# Patient Record
Sex: Male | Born: 1955
Health system: Southern US, Community
[De-identification: ages and names within clinical notes are randomized; demographics above are authoritative.]

## PROBLEM LIST (undated history)

## (undated) DIAGNOSIS — L0390 Cellulitis, unspecified: Secondary | ICD-10-CM

---

## 2016-06-05 DIAGNOSIS — E291 Testicular hypofunction: Secondary | ICD-10-CM | POA: Diagnosis not present

## 2016-06-05 DIAGNOSIS — R972 Elevated prostate specific antigen [PSA]: Secondary | ICD-10-CM | POA: Diagnosis not present

## 2016-06-27 DIAGNOSIS — E291 Testicular hypofunction: Secondary | ICD-10-CM | POA: Diagnosis not present

## 2016-08-02 DIAGNOSIS — N400 Enlarged prostate without lower urinary tract symptoms: Secondary | ICD-10-CM | POA: Diagnosis not present

## 2016-08-02 DIAGNOSIS — E784 Other hyperlipidemia: Secondary | ICD-10-CM | POA: Diagnosis not present

## 2016-08-02 DIAGNOSIS — E782 Mixed hyperlipidemia: Secondary | ICD-10-CM | POA: Diagnosis not present

## 2016-08-02 DIAGNOSIS — E785 Hyperlipidemia, unspecified: Secondary | ICD-10-CM | POA: Diagnosis not present

## 2016-08-02 DIAGNOSIS — E291 Testicular hypofunction: Secondary | ICD-10-CM | POA: Diagnosis not present

## 2016-09-20 DIAGNOSIS — E7800 Pure hypercholesterolemia, unspecified: Secondary | ICD-10-CM | POA: Diagnosis not present

## 2016-09-20 DIAGNOSIS — H25013 Cortical age-related cataract, bilateral: Secondary | ICD-10-CM | POA: Diagnosis not present

## 2016-09-20 DIAGNOSIS — R0683 Snoring: Secondary | ICD-10-CM | POA: Diagnosis not present

## 2016-09-20 DIAGNOSIS — F419 Anxiety disorder, unspecified: Secondary | ICD-10-CM | POA: Diagnosis not present

## 2016-09-20 DIAGNOSIS — Z8719 Personal history of other diseases of the digestive system: Secondary | ICD-10-CM | POA: Diagnosis not present

## 2016-09-20 DIAGNOSIS — G479 Sleep disorder, unspecified: Secondary | ICD-10-CM | POA: Diagnosis not present

## 2016-09-20 DIAGNOSIS — H9193 Unspecified hearing loss, bilateral: Secondary | ICD-10-CM | POA: Diagnosis not present

## 2016-09-20 DIAGNOSIS — N401 Enlarged prostate with lower urinary tract symptoms: Secondary | ICD-10-CM | POA: Diagnosis not present

## 2016-09-20 DIAGNOSIS — K529 Noninfective gastroenteritis and colitis, unspecified: Secondary | ICD-10-CM | POA: Diagnosis not present

## 2016-09-20 DIAGNOSIS — F429 Obsessive-compulsive disorder, unspecified: Secondary | ICD-10-CM | POA: Diagnosis not present

## 2016-11-12 DIAGNOSIS — E538 Deficiency of other specified B group vitamins: Secondary | ICD-10-CM | POA: Diagnosis not present

## 2016-11-12 DIAGNOSIS — R972 Elevated prostate specific antigen [PSA]: Secondary | ICD-10-CM | POA: Diagnosis not present

## 2016-11-14 DIAGNOSIS — H903 Sensorineural hearing loss, bilateral: Secondary | ICD-10-CM | POA: Diagnosis not present

## 2016-11-14 DIAGNOSIS — G4710 Hypersomnia, unspecified: Secondary | ICD-10-CM | POA: Diagnosis not present

## 2016-11-29 DIAGNOSIS — E291 Testicular hypofunction: Secondary | ICD-10-CM | POA: Diagnosis not present

## 2016-12-04 DIAGNOSIS — L738 Other specified follicular disorders: Secondary | ICD-10-CM | POA: Diagnosis not present

## 2016-12-04 DIAGNOSIS — L821 Other seborrheic keratosis: Secondary | ICD-10-CM | POA: Diagnosis not present

## 2016-12-04 DIAGNOSIS — R202 Paresthesia of skin: Secondary | ICD-10-CM | POA: Diagnosis not present

## 2016-12-04 DIAGNOSIS — L708 Other acne: Secondary | ICD-10-CM | POA: Diagnosis not present

## 2016-12-24 DIAGNOSIS — H903 Sensorineural hearing loss, bilateral: Secondary | ICD-10-CM | POA: Diagnosis not present

## 2017-01-03 DIAGNOSIS — H2513 Age-related nuclear cataract, bilateral: Secondary | ICD-10-CM | POA: Diagnosis not present

## 2017-01-03 DIAGNOSIS — H25013 Cortical age-related cataract, bilateral: Secondary | ICD-10-CM | POA: Diagnosis not present

## 2017-01-04 DIAGNOSIS — E291 Testicular hypofunction: Secondary | ICD-10-CM | POA: Diagnosis not present

## 2017-01-29 DIAGNOSIS — Z01818 Encounter for other preprocedural examination: Secondary | ICD-10-CM | POA: Diagnosis not present

## 2017-01-29 DIAGNOSIS — E7800 Pure hypercholesterolemia, unspecified: Secondary | ICD-10-CM | POA: Diagnosis not present

## 2017-01-29 DIAGNOSIS — R5383 Other fatigue: Secondary | ICD-10-CM | POA: Diagnosis not present

## 2017-01-29 DIAGNOSIS — G4710 Hypersomnia, unspecified: Secondary | ICD-10-CM | POA: Diagnosis not present

## 2017-01-29 DIAGNOSIS — F419 Anxiety disorder, unspecified: Secondary | ICD-10-CM | POA: Diagnosis not present

## 2017-01-29 DIAGNOSIS — G4733 Obstructive sleep apnea (adult) (pediatric): Secondary | ICD-10-CM | POA: Diagnosis not present

## 2017-01-29 DIAGNOSIS — F605 Obsessive-compulsive personality disorder: Secondary | ICD-10-CM | POA: Diagnosis not present

## 2017-01-29 DIAGNOSIS — N401 Enlarged prostate with lower urinary tract symptoms: Secondary | ICD-10-CM | POA: Diagnosis not present

## 2017-01-29 DIAGNOSIS — N400 Enlarged prostate without lower urinary tract symptoms: Secondary | ICD-10-CM | POA: Diagnosis not present

## 2017-01-29 DIAGNOSIS — R0683 Snoring: Secondary | ICD-10-CM | POA: Diagnosis not present

## 2017-01-29 DIAGNOSIS — F429 Obsessive-compulsive disorder, unspecified: Secondary | ICD-10-CM | POA: Diagnosis not present

## 2017-01-29 DIAGNOSIS — F411 Generalized anxiety disorder: Secondary | ICD-10-CM | POA: Diagnosis not present

## 2017-01-29 DIAGNOSIS — Z85820 Personal history of malignant melanoma of skin: Secondary | ICD-10-CM | POA: Diagnosis not present

## 2017-01-29 DIAGNOSIS — E785 Hyperlipidemia, unspecified: Secondary | ICD-10-CM | POA: Diagnosis not present

## 2017-02-04 DIAGNOSIS — E538 Deficiency of other specified B group vitamins: Secondary | ICD-10-CM | POA: Diagnosis not present

## 2017-02-04 DIAGNOSIS — E7800 Pure hypercholesterolemia, unspecified: Secondary | ICD-10-CM | POA: Diagnosis not present

## 2017-02-04 DIAGNOSIS — R5383 Other fatigue: Secondary | ICD-10-CM | POA: Diagnosis not present

## 2017-02-05 DIAGNOSIS — H6121 Impacted cerumen, right ear: Secondary | ICD-10-CM | POA: Diagnosis not present

## 2017-02-05 DIAGNOSIS — H6591 Unspecified nonsuppurative otitis media, right ear: Secondary | ICD-10-CM | POA: Diagnosis not present

## 2017-02-06 DIAGNOSIS — M1990 Unspecified osteoarthritis, unspecified site: Secondary | ICD-10-CM | POA: Diagnosis not present

## 2017-02-06 DIAGNOSIS — H52202 Unspecified astigmatism, left eye: Secondary | ICD-10-CM | POA: Diagnosis not present

## 2017-02-06 DIAGNOSIS — H2512 Age-related nuclear cataract, left eye: Secondary | ICD-10-CM | POA: Diagnosis not present

## 2017-02-06 DIAGNOSIS — E785 Hyperlipidemia, unspecified: Secondary | ICD-10-CM | POA: Diagnosis not present

## 2017-02-06 DIAGNOSIS — G4730 Sleep apnea, unspecified: Secondary | ICD-10-CM | POA: Diagnosis not present

## 2017-02-16 DIAGNOSIS — Z1159 Encounter for screening for other viral diseases: Secondary | ICD-10-CM | POA: Diagnosis not present

## 2017-02-16 DIAGNOSIS — E291 Testicular hypofunction: Secondary | ICD-10-CM | POA: Diagnosis not present

## 2017-02-20 DIAGNOSIS — M1990 Unspecified osteoarthritis, unspecified site: Secondary | ICD-10-CM | POA: Diagnosis not present

## 2017-02-20 DIAGNOSIS — H2511 Age-related nuclear cataract, right eye: Secondary | ICD-10-CM | POA: Diagnosis not present

## 2017-02-20 DIAGNOSIS — E785 Hyperlipidemia, unspecified: Secondary | ICD-10-CM | POA: Diagnosis not present

## 2017-02-20 DIAGNOSIS — G4730 Sleep apnea, unspecified: Secondary | ICD-10-CM | POA: Diagnosis not present

## 2017-02-20 DIAGNOSIS — H25011 Cortical age-related cataract, right eye: Secondary | ICD-10-CM | POA: Diagnosis not present

## 2017-02-25 DIAGNOSIS — Z7189 Other specified counseling: Secondary | ICD-10-CM | POA: Diagnosis not present

## 2017-02-25 DIAGNOSIS — E349 Endocrine disorder, unspecified: Secondary | ICD-10-CM | POA: Diagnosis not present

## 2017-02-25 DIAGNOSIS — Z0000 Encounter for general adult medical examination without abnormal findings: Secondary | ICD-10-CM | POA: Diagnosis not present

## 2017-02-25 DIAGNOSIS — E291 Testicular hypofunction: Secondary | ICD-10-CM | POA: Diagnosis not present

## 2017-02-25 DIAGNOSIS — F429 Obsessive-compulsive disorder, unspecified: Secondary | ICD-10-CM | POA: Diagnosis not present

## 2017-02-25 DIAGNOSIS — N401 Enlarged prostate with lower urinary tract symptoms: Secondary | ICD-10-CM | POA: Diagnosis not present

## 2017-02-25 DIAGNOSIS — F419 Anxiety disorder, unspecified: Secondary | ICD-10-CM | POA: Diagnosis not present

## 2017-02-25 DIAGNOSIS — Z85820 Personal history of malignant melanoma of skin: Secondary | ICD-10-CM | POA: Diagnosis not present

## 2017-02-25 DIAGNOSIS — R7301 Impaired fasting glucose: Secondary | ICD-10-CM | POA: Diagnosis not present

## 2017-02-25 DIAGNOSIS — E7800 Pure hypercholesterolemia, unspecified: Secondary | ICD-10-CM | POA: Diagnosis not present

## 2017-03-15 DIAGNOSIS — D509 Iron deficiency anemia, unspecified: Secondary | ICD-10-CM | POA: Diagnosis not present

## 2017-03-21 DIAGNOSIS — H52202 Unspecified astigmatism, left eye: Secondary | ICD-10-CM | POA: Diagnosis not present

## 2017-03-21 DIAGNOSIS — E291 Testicular hypofunction: Secondary | ICD-10-CM | POA: Diagnosis not present

## 2017-03-21 DIAGNOSIS — H26492 Other secondary cataract, left eye: Secondary | ICD-10-CM | POA: Diagnosis not present

## 2017-03-22 DIAGNOSIS — N401 Enlarged prostate with lower urinary tract symptoms: Secondary | ICD-10-CM | POA: Diagnosis not present

## 2017-03-22 DIAGNOSIS — R972 Elevated prostate specific antigen [PSA]: Secondary | ICD-10-CM | POA: Diagnosis not present

## 2017-03-22 DIAGNOSIS — R3912 Poor urinary stream: Secondary | ICD-10-CM | POA: Diagnosis not present

## 2017-03-25 DIAGNOSIS — Z23 Encounter for immunization: Secondary | ICD-10-CM | POA: Diagnosis not present

## 2017-03-26 DIAGNOSIS — L729 Follicular cyst of the skin and subcutaneous tissue, unspecified: Secondary | ICD-10-CM | POA: Diagnosis not present

## 2017-03-26 DIAGNOSIS — H26491 Other secondary cataract, right eye: Secondary | ICD-10-CM | POA: Diagnosis not present

## 2017-04-04 DIAGNOSIS — L729 Follicular cyst of the skin and subcutaneous tissue, unspecified: Secondary | ICD-10-CM | POA: Diagnosis not present

## 2017-04-08 DIAGNOSIS — L729 Follicular cyst of the skin and subcutaneous tissue, unspecified: Secondary | ICD-10-CM | POA: Diagnosis not present

## 2017-04-09 DIAGNOSIS — L600 Ingrowing nail: Secondary | ICD-10-CM | POA: Diagnosis not present

## 2017-04-19 DIAGNOSIS — L600 Ingrowing nail: Secondary | ICD-10-CM | POA: Diagnosis not present

## 2017-05-01 DIAGNOSIS — E291 Testicular hypofunction: Secondary | ICD-10-CM | POA: Diagnosis not present

## 2017-05-03 DIAGNOSIS — L02611 Cutaneous abscess of right foot: Secondary | ICD-10-CM | POA: Diagnosis not present

## 2017-05-09 DIAGNOSIS — L600 Ingrowing nail: Secondary | ICD-10-CM | POA: Diagnosis not present

## 2017-05-13 DIAGNOSIS — H53002 Unspecified amblyopia, left eye: Secondary | ICD-10-CM | POA: Diagnosis not present

## 2017-05-15 DIAGNOSIS — L239 Allergic contact dermatitis, unspecified cause: Secondary | ICD-10-CM | POA: Diagnosis not present

## 2017-06-04 DIAGNOSIS — N401 Enlarged prostate with lower urinary tract symptoms: Secondary | ICD-10-CM | POA: Diagnosis not present

## 2017-06-04 DIAGNOSIS — R3914 Feeling of incomplete bladder emptying: Secondary | ICD-10-CM | POA: Diagnosis not present

## 2017-06-14 DIAGNOSIS — E291 Testicular hypofunction: Secondary | ICD-10-CM | POA: Diagnosis not present

## 2017-06-27 DIAGNOSIS — E291 Testicular hypofunction: Secondary | ICD-10-CM | POA: Diagnosis not present

## 2017-08-06 DIAGNOSIS — E291 Testicular hypofunction: Secondary | ICD-10-CM | POA: Diagnosis not present

## 2017-08-06 DIAGNOSIS — E7800 Pure hypercholesterolemia, unspecified: Secondary | ICD-10-CM | POA: Diagnosis not present

## 2017-08-24 DIAGNOSIS — E291 Testicular hypofunction: Secondary | ICD-10-CM | POA: Diagnosis not present

## 2017-09-10 DIAGNOSIS — R972 Elevated prostate specific antigen [PSA]: Secondary | ICD-10-CM | POA: Diagnosis not present

## 2017-09-16 DIAGNOSIS — E291 Testicular hypofunction: Secondary | ICD-10-CM | POA: Diagnosis not present

## 2017-09-30 DIAGNOSIS — R3912 Poor urinary stream: Secondary | ICD-10-CM | POA: Diagnosis not present

## 2017-09-30 DIAGNOSIS — R972 Elevated prostate specific antigen [PSA]: Secondary | ICD-10-CM | POA: Diagnosis not present

## 2017-10-07 DIAGNOSIS — R972 Elevated prostate specific antigen [PSA]: Secondary | ICD-10-CM | POA: Diagnosis not present

## 2017-10-11 DIAGNOSIS — R351 Nocturia: Secondary | ICD-10-CM | POA: Diagnosis not present

## 2018-03-03 DIAGNOSIS — E7800 Pure hypercholesterolemia, unspecified: Secondary | ICD-10-CM | POA: Diagnosis not present

## 2018-03-03 DIAGNOSIS — Z8739 Personal history of other diseases of the musculoskeletal system and connective tissue: Secondary | ICD-10-CM | POA: Diagnosis not present

## 2018-03-03 DIAGNOSIS — F419 Anxiety disorder, unspecified: Secondary | ICD-10-CM | POA: Diagnosis not present

## 2018-03-03 DIAGNOSIS — Z0000 Encounter for general adult medical examination without abnormal findings: Secondary | ICD-10-CM | POA: Diagnosis not present

## 2018-03-03 DIAGNOSIS — E291 Testicular hypofunction: Secondary | ICD-10-CM | POA: Diagnosis not present

## 2018-03-03 DIAGNOSIS — Z85820 Personal history of malignant melanoma of skin: Secondary | ICD-10-CM | POA: Diagnosis not present

## 2018-03-03 DIAGNOSIS — N400 Enlarged prostate without lower urinary tract symptoms: Secondary | ICD-10-CM | POA: Diagnosis not present

## 2018-03-03 DIAGNOSIS — Z23 Encounter for immunization: Secondary | ICD-10-CM | POA: Diagnosis not present

## 2018-03-26 DIAGNOSIS — R972 Elevated prostate specific antigen [PSA]: Secondary | ICD-10-CM | POA: Diagnosis not present

## 2018-03-26 DIAGNOSIS — N401 Enlarged prostate with lower urinary tract symptoms: Secondary | ICD-10-CM | POA: Diagnosis not present

## 2018-03-26 DIAGNOSIS — R3912 Poor urinary stream: Secondary | ICD-10-CM | POA: Diagnosis not present

## 2018-03-26 DIAGNOSIS — N5201 Erectile dysfunction due to arterial insufficiency: Secondary | ICD-10-CM | POA: Diagnosis not present

## 2018-05-13 DIAGNOSIS — Z85820 Personal history of malignant melanoma of skin: Secondary | ICD-10-CM | POA: Diagnosis not present

## 2018-05-13 DIAGNOSIS — D485 Neoplasm of uncertain behavior of skin: Secondary | ICD-10-CM | POA: Diagnosis not present

## 2018-05-13 DIAGNOSIS — D2271 Melanocytic nevi of right lower limb, including hip: Secondary | ICD-10-CM | POA: Diagnosis not present

## 2018-05-13 DIAGNOSIS — L218 Other seborrheic dermatitis: Secondary | ICD-10-CM | POA: Diagnosis not present

## 2018-05-13 DIAGNOSIS — L814 Other melanin hyperpigmentation: Secondary | ICD-10-CM | POA: Diagnosis not present

## 2018-06-27 DIAGNOSIS — I10 Essential (primary) hypertension: Secondary | ICD-10-CM | POA: Diagnosis not present

## 2018-06-27 DIAGNOSIS — E349 Endocrine disorder, unspecified: Secondary | ICD-10-CM | POA: Diagnosis not present

## 2018-06-27 DIAGNOSIS — Z5181 Encounter for therapeutic drug level monitoring: Secondary | ICD-10-CM | POA: Diagnosis not present

## 2018-06-27 DIAGNOSIS — Z8639 Personal history of other endocrine, nutritional and metabolic disease: Secondary | ICD-10-CM | POA: Diagnosis not present

## 2018-06-30 DIAGNOSIS — I10 Essential (primary) hypertension: Secondary | ICD-10-CM | POA: Diagnosis not present

## 2018-06-30 DIAGNOSIS — Z79899 Other long term (current) drug therapy: Secondary | ICD-10-CM | POA: Diagnosis not present

## 2018-06-30 DIAGNOSIS — E349 Endocrine disorder, unspecified: Secondary | ICD-10-CM | POA: Diagnosis not present

## 2018-06-30 DIAGNOSIS — Z8639 Personal history of other endocrine, nutritional and metabolic disease: Secondary | ICD-10-CM | POA: Diagnosis not present

## 2018-07-04 DIAGNOSIS — F419 Anxiety disorder, unspecified: Secondary | ICD-10-CM | POA: Diagnosis not present

## 2018-07-04 DIAGNOSIS — M8580 Other specified disorders of bone density and structure, unspecified site: Secondary | ICD-10-CM | POA: Diagnosis not present

## 2018-07-04 DIAGNOSIS — E7800 Pure hypercholesterolemia, unspecified: Secondary | ICD-10-CM | POA: Diagnosis not present

## 2018-07-24 DIAGNOSIS — E349 Endocrine disorder, unspecified: Secondary | ICD-10-CM | POA: Diagnosis not present

## 2018-08-08 ENCOUNTER — Other Ambulatory Visit: Payer: Self-pay | Admitting: Family Medicine

## 2018-08-08 DIAGNOSIS — M8580 Other specified disorders of bone density and structure, unspecified site: Secondary | ICD-10-CM

## 2018-08-11 DIAGNOSIS — N5201 Erectile dysfunction due to arterial insufficiency: Secondary | ICD-10-CM | POA: Diagnosis not present

## 2018-08-11 DIAGNOSIS — R3912 Poor urinary stream: Secondary | ICD-10-CM | POA: Diagnosis not present

## 2018-08-15 DIAGNOSIS — E349 Endocrine disorder, unspecified: Secondary | ICD-10-CM | POA: Diagnosis not present

## 2018-08-15 DIAGNOSIS — Z5181 Encounter for therapeutic drug level monitoring: Secondary | ICD-10-CM | POA: Diagnosis not present

## 2018-08-15 DIAGNOSIS — N400 Enlarged prostate without lower urinary tract symptoms: Secondary | ICD-10-CM | POA: Diagnosis not present

## 2018-08-15 DIAGNOSIS — Z125 Encounter for screening for malignant neoplasm of prostate: Secondary | ICD-10-CM | POA: Diagnosis not present

## 2018-08-18 DIAGNOSIS — M545 Low back pain: Secondary | ICD-10-CM | POA: Diagnosis not present

## 2018-09-19 DIAGNOSIS — R972 Elevated prostate specific antigen [PSA]: Secondary | ICD-10-CM | POA: Diagnosis not present

## 2018-09-26 DIAGNOSIS — R3912 Poor urinary stream: Secondary | ICD-10-CM | POA: Diagnosis not present

## 2018-09-26 DIAGNOSIS — N5201 Erectile dysfunction due to arterial insufficiency: Secondary | ICD-10-CM | POA: Diagnosis not present

## 2018-09-26 DIAGNOSIS — N401 Enlarged prostate with lower urinary tract symptoms: Secondary | ICD-10-CM | POA: Diagnosis not present

## 2018-09-26 DIAGNOSIS — R972 Elevated prostate specific antigen [PSA]: Secondary | ICD-10-CM | POA: Diagnosis not present

## 2018-10-14 DIAGNOSIS — E291 Testicular hypofunction: Secondary | ICD-10-CM | POA: Diagnosis not present

## 2018-10-17 ENCOUNTER — Other Ambulatory Visit: Payer: Self-pay

## 2018-10-21 DIAGNOSIS — N400 Enlarged prostate without lower urinary tract symptoms: Secondary | ICD-10-CM | POA: Diagnosis not present

## 2018-10-21 DIAGNOSIS — E349 Endocrine disorder, unspecified: Secondary | ICD-10-CM | POA: Diagnosis not present

## 2018-10-21 DIAGNOSIS — Z125 Encounter for screening for malignant neoplasm of prostate: Secondary | ICD-10-CM | POA: Diagnosis not present

## 2018-10-21 DIAGNOSIS — Z5181 Encounter for therapeutic drug level monitoring: Secondary | ICD-10-CM | POA: Diagnosis not present

## 2018-11-11 DIAGNOSIS — Z961 Presence of intraocular lens: Secondary | ICD-10-CM | POA: Diagnosis not present

## 2018-11-11 DIAGNOSIS — H524 Presbyopia: Secondary | ICD-10-CM | POA: Diagnosis not present

## 2018-11-11 DIAGNOSIS — H5310 Unspecified subjective visual disturbances: Secondary | ICD-10-CM | POA: Diagnosis not present

## 2018-11-11 DIAGNOSIS — H53002 Unspecified amblyopia, left eye: Secondary | ICD-10-CM | POA: Diagnosis not present

## 2018-12-10 ENCOUNTER — Ambulatory Visit
Admission: RE | Admit: 2018-12-10 | Discharge: 2018-12-10 | Disposition: A | Discharge status: Home / Self Care | Payer: Medicare Other | Source: Ambulatory Visit | Attending: Family Medicine | Admitting: Family Medicine

## 2018-12-10 DIAGNOSIS — M8580 Other specified disorders of bone density and structure, unspecified site: Secondary | ICD-10-CM

## 2018-12-10 DIAGNOSIS — Z13820 Encounter for screening for osteoporosis: Secondary | ICD-10-CM | POA: Diagnosis not present

## 2018-12-29 ENCOUNTER — Other Ambulatory Visit: Payer: Self-pay | Admitting: Urology

## 2018-12-29 DIAGNOSIS — R350 Frequency of micturition: Secondary | ICD-10-CM | POA: Diagnosis not present

## 2018-12-29 DIAGNOSIS — E349 Endocrine disorder, unspecified: Secondary | ICD-10-CM | POA: Diagnosis not present

## 2018-12-29 DIAGNOSIS — R972 Elevated prostate specific antigen [PSA]: Secondary | ICD-10-CM

## 2018-12-29 DIAGNOSIS — N401 Enlarged prostate with lower urinary tract symptoms: Secondary | ICD-10-CM | POA: Diagnosis not present

## 2018-12-29 DIAGNOSIS — N5201 Erectile dysfunction due to arterial insufficiency: Secondary | ICD-10-CM | POA: Diagnosis not present

## 2019-01-26 ENCOUNTER — Ambulatory Visit
Admission: RE | Admit: 2019-01-26 | Discharge: 2019-01-26 | Disposition: A | Discharge status: Home / Self Care | Payer: Medicare Other | Source: Ambulatory Visit | Attending: Urology | Admitting: Urology

## 2019-01-26 ENCOUNTER — Other Ambulatory Visit: Payer: Self-pay

## 2019-01-26 DIAGNOSIS — R972 Elevated prostate specific antigen [PSA]: Secondary | ICD-10-CM

## 2019-01-26 DIAGNOSIS — N400 Enlarged prostate without lower urinary tract symptoms: Secondary | ICD-10-CM | POA: Diagnosis not present

## 2019-01-26 MED ORDER — GADOBENATE DIMEGLUMINE 529 MG/ML IV SOLN
16.0000 mL | Freq: Once | INTRAVENOUS | Status: AC | PRN
  Administered 2019-01-26: 16 mL via INTRAVENOUS

## 2019-01-29 DIAGNOSIS — N401 Enlarged prostate with lower urinary tract symptoms: Secondary | ICD-10-CM | POA: Diagnosis not present

## 2019-01-29 DIAGNOSIS — R350 Frequency of micturition: Secondary | ICD-10-CM | POA: Diagnosis not present

## 2019-01-29 DIAGNOSIS — R972 Elevated prostate specific antigen [PSA]: Secondary | ICD-10-CM | POA: Diagnosis not present

## 2019-01-29 DIAGNOSIS — E349 Endocrine disorder, unspecified: Secondary | ICD-10-CM | POA: Diagnosis not present

## 2019-02-04 DIAGNOSIS — R6889 Other general symptoms and signs: Secondary | ICD-10-CM | POA: Diagnosis not present

## 2019-02-09 DIAGNOSIS — N410 Acute prostatitis: Secondary | ICD-10-CM | POA: Diagnosis not present

## 2019-03-03 DIAGNOSIS — R42 Dizziness and giddiness: Secondary | ICD-10-CM | POA: Diagnosis not present

## 2019-03-03 DIAGNOSIS — H9313 Tinnitus, bilateral: Secondary | ICD-10-CM | POA: Diagnosis not present

## 2019-03-03 DIAGNOSIS — H9319 Tinnitus, unspecified ear: Secondary | ICD-10-CM | POA: Diagnosis not present

## 2019-03-13 DIAGNOSIS — Z0000 Encounter for general adult medical examination without abnormal findings: Secondary | ICD-10-CM | POA: Diagnosis not present

## 2019-03-13 DIAGNOSIS — F419 Anxiety disorder, unspecified: Secondary | ICD-10-CM | POA: Diagnosis not present

## 2019-03-13 DIAGNOSIS — Z23 Encounter for immunization: Secondary | ICD-10-CM | POA: Diagnosis not present

## 2019-03-13 DIAGNOSIS — F605 Obsessive-compulsive personality disorder: Secondary | ICD-10-CM | POA: Diagnosis not present

## 2019-03-13 DIAGNOSIS — N400 Enlarged prostate without lower urinary tract symptoms: Secondary | ICD-10-CM | POA: Diagnosis not present

## 2019-03-13 DIAGNOSIS — E291 Testicular hypofunction: Secondary | ICD-10-CM | POA: Diagnosis not present

## 2019-03-13 DIAGNOSIS — E7800 Pure hypercholesterolemia, unspecified: Secondary | ICD-10-CM | POA: Diagnosis not present

## 2019-03-13 DIAGNOSIS — Z85820 Personal history of malignant melanoma of skin: Secondary | ICD-10-CM | POA: Diagnosis not present

## 2019-03-23 DIAGNOSIS — H0100A Unspecified blepharitis right eye, upper and lower eyelids: Secondary | ICD-10-CM | POA: Diagnosis not present

## 2019-03-25 DIAGNOSIS — R3912 Poor urinary stream: Secondary | ICD-10-CM | POA: Diagnosis not present

## 2019-03-25 DIAGNOSIS — R350 Frequency of micturition: Secondary | ICD-10-CM | POA: Diagnosis not present

## 2019-03-25 DIAGNOSIS — N401 Enlarged prostate with lower urinary tract symptoms: Secondary | ICD-10-CM | POA: Diagnosis not present

## 2019-05-11 DIAGNOSIS — D2272 Melanocytic nevi of left lower limb, including hip: Secondary | ICD-10-CM | POA: Diagnosis not present

## 2019-05-11 DIAGNOSIS — D2261 Melanocytic nevi of right upper limb, including shoulder: Secondary | ICD-10-CM | POA: Diagnosis not present

## 2019-05-11 DIAGNOSIS — D2262 Melanocytic nevi of left upper limb, including shoulder: Secondary | ICD-10-CM | POA: Diagnosis not present

## 2019-05-11 DIAGNOSIS — D2271 Melanocytic nevi of right lower limb, including hip: Secondary | ICD-10-CM | POA: Diagnosis not present

## 2019-05-11 DIAGNOSIS — Z85820 Personal history of malignant melanoma of skin: Secondary | ICD-10-CM | POA: Diagnosis not present

## 2019-05-11 DIAGNOSIS — D225 Melanocytic nevi of trunk: Secondary | ICD-10-CM | POA: Diagnosis not present

## 2019-05-11 DIAGNOSIS — L738 Other specified follicular disorders: Secondary | ICD-10-CM | POA: Diagnosis not present

## 2019-05-11 DIAGNOSIS — D485 Neoplasm of uncertain behavior of skin: Secondary | ICD-10-CM | POA: Diagnosis not present

## 2019-05-11 DIAGNOSIS — L821 Other seborrheic keratosis: Secondary | ICD-10-CM | POA: Diagnosis not present

## 2019-06-03 DIAGNOSIS — H43813 Vitreous degeneration, bilateral: Secondary | ICD-10-CM | POA: Diagnosis not present

## 2019-06-03 DIAGNOSIS — H53022 Refractive amblyopia, left eye: Secondary | ICD-10-CM | POA: Diagnosis not present

## 2019-06-03 DIAGNOSIS — H3554 Dystrophies primarily involving the retinal pigment epithelium: Secondary | ICD-10-CM | POA: Diagnosis not present

## 2019-06-03 DIAGNOSIS — H43392 Other vitreous opacities, left eye: Secondary | ICD-10-CM | POA: Diagnosis not present

## 2019-06-11 DIAGNOSIS — R338 Other retention of urine: Secondary | ICD-10-CM | POA: Diagnosis not present

## 2019-07-07 DIAGNOSIS — R3912 Poor urinary stream: Secondary | ICD-10-CM | POA: Diagnosis not present

## 2019-07-07 DIAGNOSIS — M25511 Pain in right shoulder: Secondary | ICD-10-CM | POA: Diagnosis not present

## 2019-07-07 DIAGNOSIS — R338 Other retention of urine: Secondary | ICD-10-CM | POA: Diagnosis not present

## 2019-07-08 ENCOUNTER — Other Ambulatory Visit: Payer: Self-pay | Admitting: Family Medicine

## 2019-07-08 ENCOUNTER — Ambulatory Visit
Admission: RE | Admit: 2019-07-08 | Discharge: 2019-07-08 | Disposition: A | Discharge status: Home / Self Care | Payer: Medicare Other | Source: Ambulatory Visit | Attending: Family Medicine | Admitting: Family Medicine

## 2019-07-08 DIAGNOSIS — M25511 Pain in right shoulder: Secondary | ICD-10-CM | POA: Diagnosis not present

## 2019-07-08 DIAGNOSIS — M47812 Spondylosis without myelopathy or radiculopathy, cervical region: Secondary | ICD-10-CM | POA: Diagnosis not present

## 2019-07-11 ENCOUNTER — Ambulatory Visit: Payer: Medicare Other | Attending: Internal Medicine

## 2019-07-11 DIAGNOSIS — Z23 Encounter for immunization: Secondary | ICD-10-CM

## 2019-07-11 NOTE — Progress Notes (Signed)
   Covid-19 Vaccination Clinic  Name:  Joshua Glover    MRN: [id number] DOB: 12/13/1955  07/11/2019  [name] was observed post Covid-19 immunization for 15 minutes without incidence. He was provided with Vaccine Information Sheet and instruction to access the V-Safe system.   [name] was instructed to call 911 with any severe reactions post vaccine: Marland Kitchen Difficulty breathing  . Swelling of your face and throat  . A fast heartbeat  . A bad rash all over your body  . Dizziness and weakness    Immunizations Administered    Name Date Dose VIS Date Route   Pfizer COVID-19 Vaccine 07/11/2019  1:58 PM 0.3 mL 05/08/2019 Intramuscular   Manufacturer: [place]   Lot: [id number]   [place]: [id number]

## 2019-07-15 ENCOUNTER — Other Ambulatory Visit: Payer: Self-pay | Admitting: Family Medicine

## 2019-07-15 DIAGNOSIS — M5030 Other cervical disc degeneration, unspecified cervical region: Secondary | ICD-10-CM

## 2019-08-03 ENCOUNTER — Other Ambulatory Visit: Payer: Self-pay

## 2019-08-03 ENCOUNTER — Ambulatory Visit
Admission: RE | Admit: 2019-08-03 | Discharge: 2019-08-03 | Disposition: A | Discharge status: Home / Self Care | Payer: Medicare Other | Source: Ambulatory Visit | Attending: Family Medicine | Admitting: Family Medicine

## 2019-08-03 DIAGNOSIS — M4802 Spinal stenosis, cervical region: Secondary | ICD-10-CM | POA: Diagnosis not present

## 2019-08-03 DIAGNOSIS — M5030 Other cervical disc degeneration, unspecified cervical region: Secondary | ICD-10-CM

## 2019-08-03 IMAGING — MR MR CERVICAL SPINE W/O CM
5 series · 32 of 48 positions shown · non-contrast
Comparison: Cervical spine radiographs [DATE]

CLINICAL DATA: Cervical disc degeneration. Neck pain and right arm
pain

EXAM:
MRI CERVICAL SPINE WITHOUT CONTRAST
TECHNIQUE: Multiplanar, multisequence MR imaging of the cervical spine was
performed. No intravenous contrast was administered.

[Series 2: T2 · sagittal · 3.0mm · 0.41mm/px · 6 of 13 slices shown (1 of 2)]
[im 1/13]
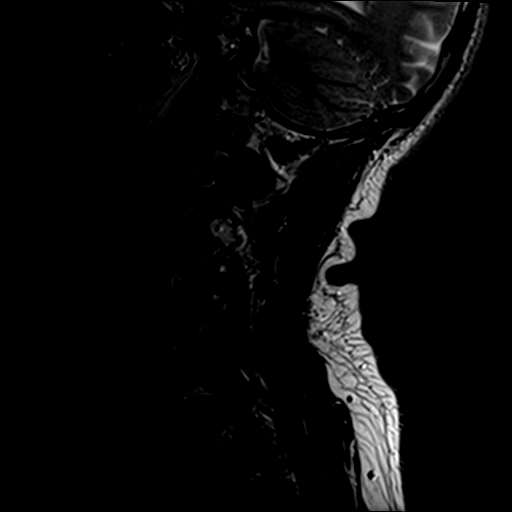
[im 3/13]
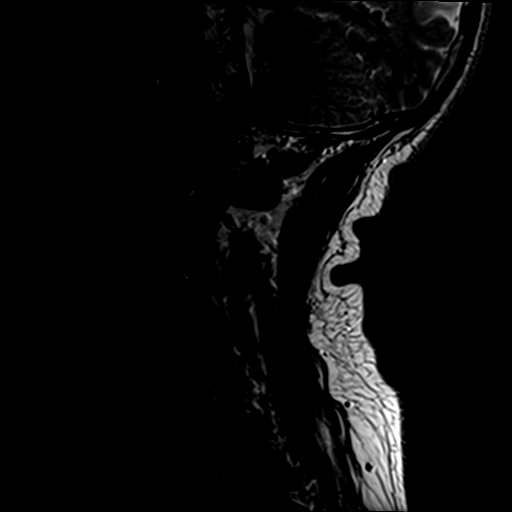
[im 5/13]
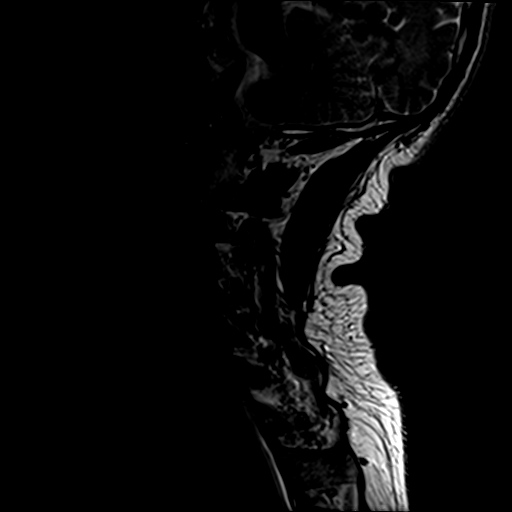
[im 8/13]
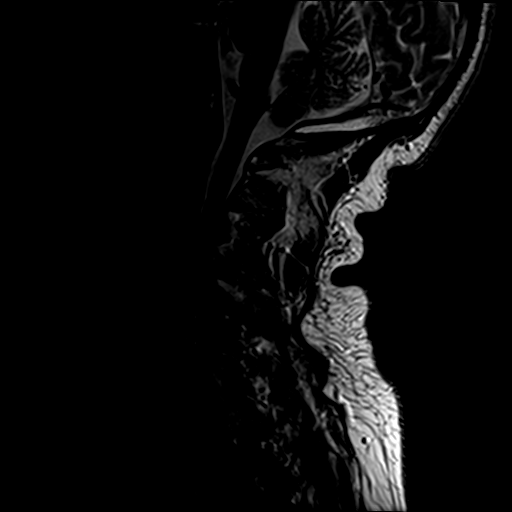
[im 10/13]
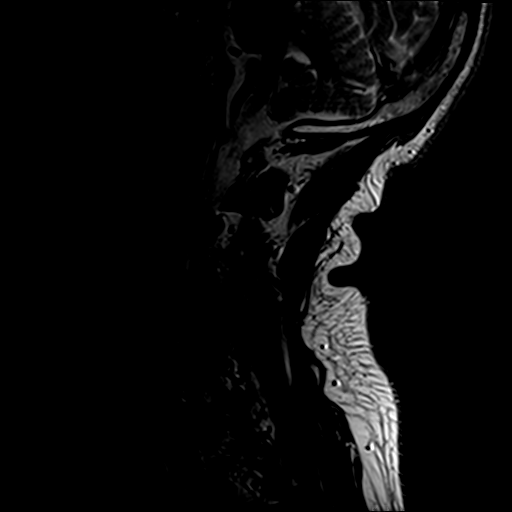
[im 13/13]
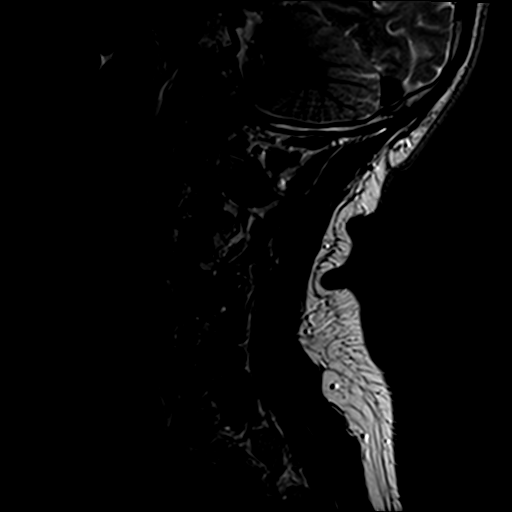

[Series 3: T1 · sagittal · 3.0mm · 0.41mm/px · 7 of 13 slices shown]
[im 1/13]
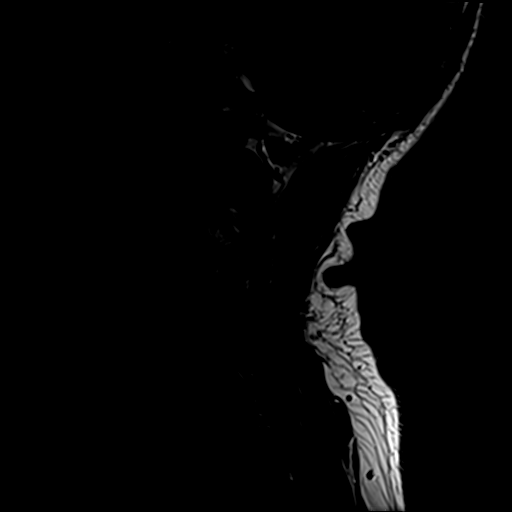
[im 3/13]
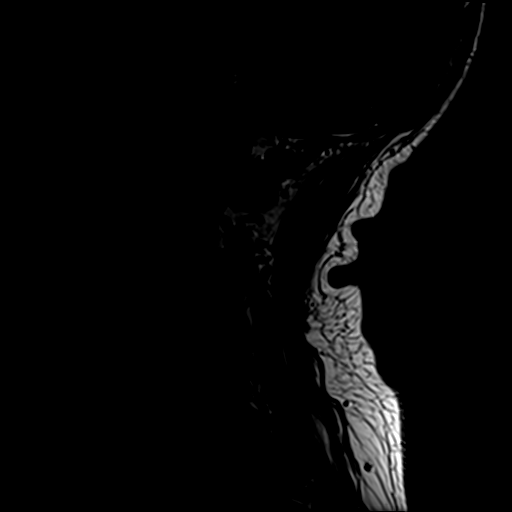
[im 5/13]
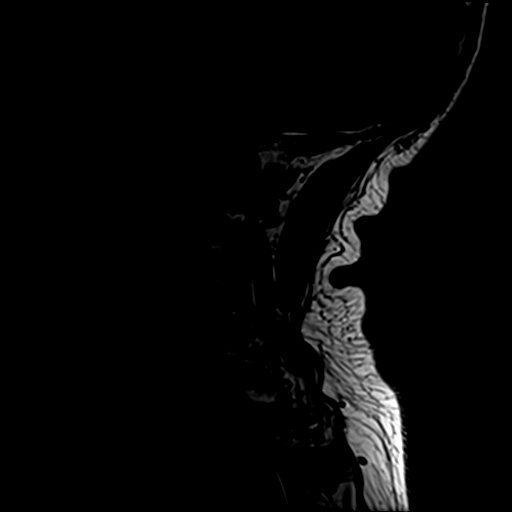
[im 7/13]
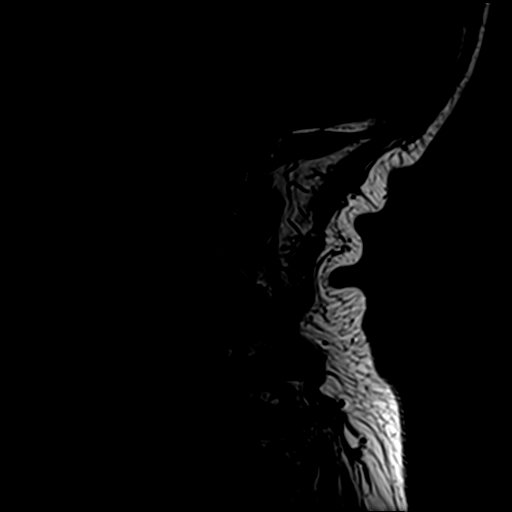
[im 9/13]
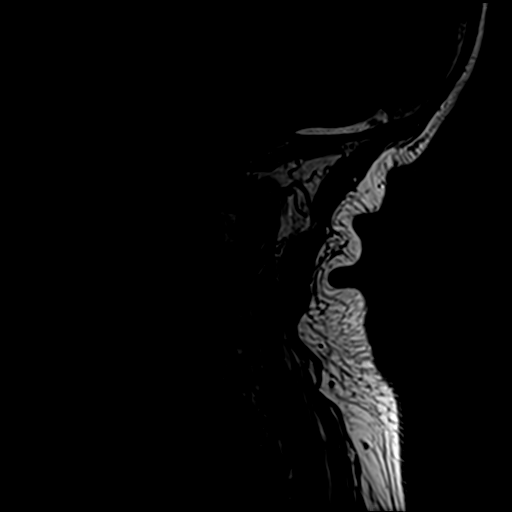
[im 11/13]
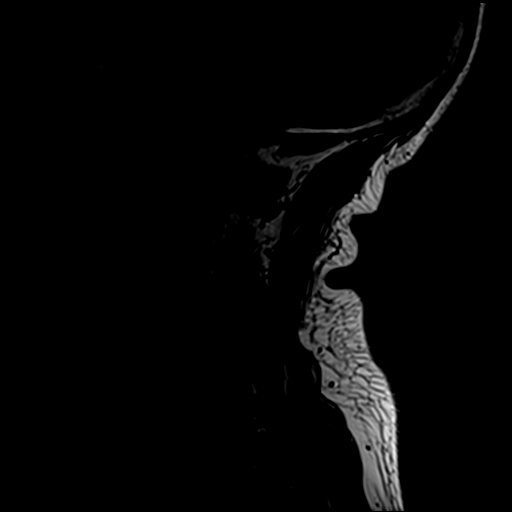
[im 13/13]
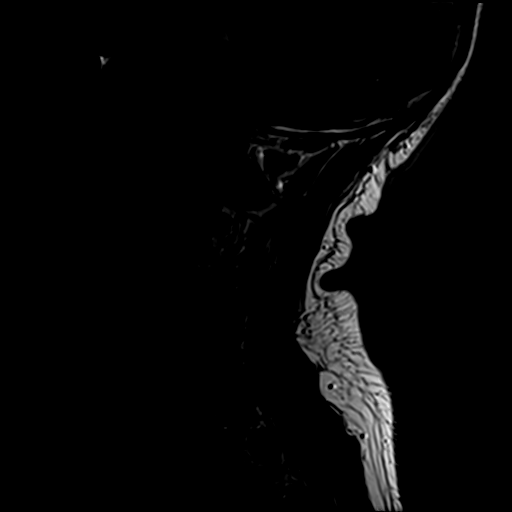

[Series 4: STIR · sagittal · 3.0mm · 0.82mm/px · 7 of 13 slices shown]
[im 1/13]
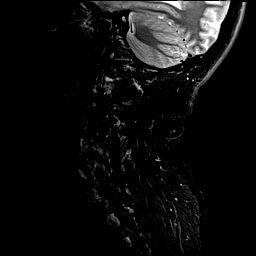
[im 3/13]
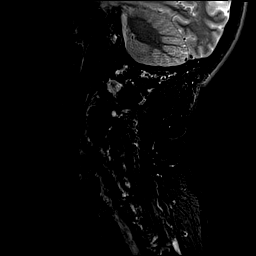
[im 5/13]
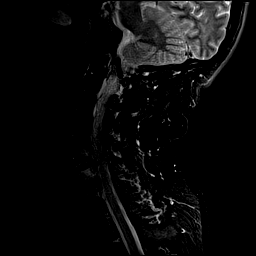
[im 7/13]
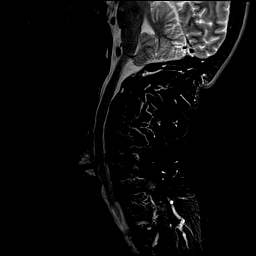
[im 9/13]
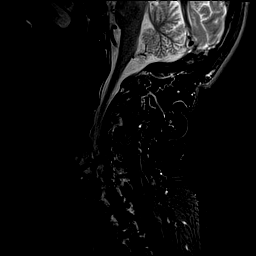
[im 11/13]
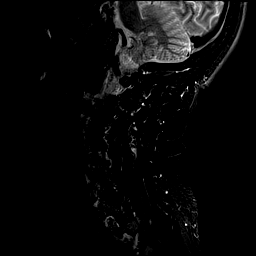
[im 13/13]
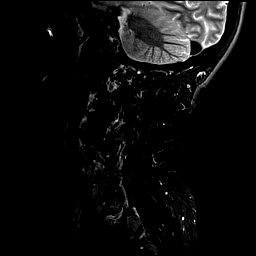

[Series 5: GRE · axial · 3.0mm · 0.47mm/px · z∈[-112,-72]mm · 4 of 26 slices shown]
[im 1/26]
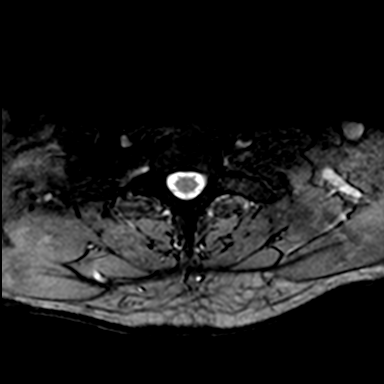
[im 4/26]
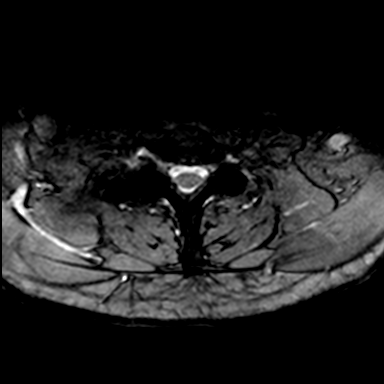
[im 8/26]
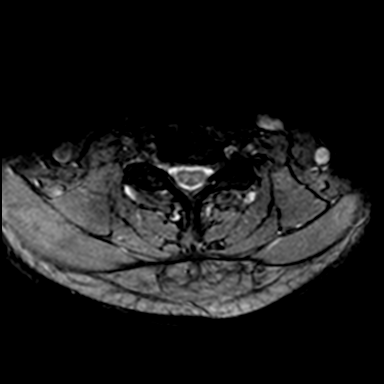
[im 12/26]
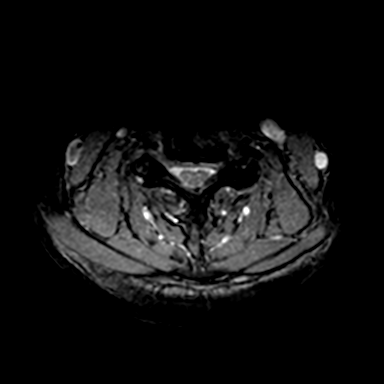

[Series 6: T2 · axial · 3.0mm · 0.94mm/px · z∈[-112,-22]mm · 8 of 26 slices shown (2 of 2)]
[im 1/26]
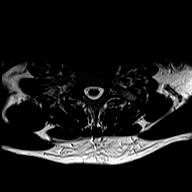
[im 4/26]
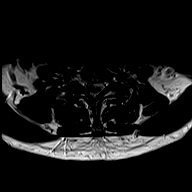
[im 8/26]
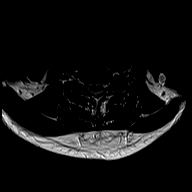
[im 12/26]
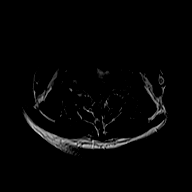
[im 14/26]
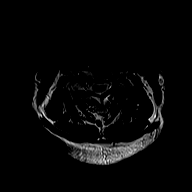
[im 18/26]
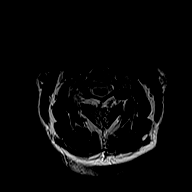
[im 22/26]
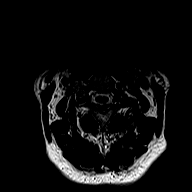
[im 26/26]
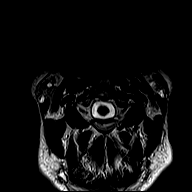

[32 of 48 positions shown; findings below may reference images not displayed]

FINDINGS: Alignment: Mild retrolisthesis C3-4. Mild anterolisthesis C4-5 and
C5-6

Vertebrae: Negative for fracture or mass. Discogenic edema at C5-6
related to disc degeneration.

Cord: No cord signal abnormality.

Posterior Fossa, vertebral arteries, paraspinal tissues: Negative

Disc levels:

C2-3: Mild disc degeneration and spurring. Left facet degeneration.
Mild left foraminal narrowing due to spurring.

C3-4: Disc degeneration with diffuse uncinate spurring and mild
facet degeneration. Mild foraminal stenosis bilaterally

C4-5: Disc degeneration with diffuse uncinate spurring. Moderate
facet degeneration bilaterally. Moderate left foraminal narrowing
and mild right foraminal narrowing due to spurring.

C5-6: Disc degeneration with diffuse uncinate spurring. Cord
flattening with mild spinal stenosis. Moderate foraminal narrowing
bilaterally left greater than right

C6-7: Disc degeneration with diffuse uncinate spurring. Mild spinal
stenosis and moderate foraminal stenosis bilaterally left greater
than right. Bilateral facet degeneration

C7-T1: Bilateral facet degeneration with mild foraminal stenosis
bilaterally.
IMPRESSION: Multilevel disc and facet degeneration throughout the cervical
spine. There is spinal and foraminal stenosis at multiple levels due
to spurring as described above.

## 2019-08-04 ENCOUNTER — Ambulatory Visit: Payer: Medicare Other | Attending: Internal Medicine

## 2019-08-04 DIAGNOSIS — Z23 Encounter for immunization: Secondary | ICD-10-CM | POA: Insufficient documentation

## 2019-08-04 NOTE — Progress Notes (Signed)
   Covid-19 Vaccination Clinic  Name:  Joshua Glover    MRN: [id number] DOB: 1955/11/29  08/04/2019  Joshua Glover was observed post Covid-19 immunization for 15 minutes without incident. He was provided with Vaccine Information Sheet and instruction to access the V-Safe system.   Joshua Glover was instructed to call 911 with any severe reactions post vaccine: Marland Kitchen Difficulty breathing  . Swelling of face and throat  . A fast heartbeat  . A bad rash all over body  . Dizziness and weakness   Immunizations Administered    Name Date Dose VIS Date Route   Pfizer COVID-19 Vaccine 08/04/2019 11:38 AM 0.3 mL 05/08/2019 Intramuscular   Manufacturer: [place]   Lot: [id number]   [place]: [id number]

## 2019-08-05 DIAGNOSIS — H35351 Cystoid macular degeneration, right eye: Secondary | ICD-10-CM | POA: Diagnosis not present

## 2019-08-05 DIAGNOSIS — H35033 Hypertensive retinopathy, bilateral: Secondary | ICD-10-CM | POA: Diagnosis not present

## 2019-08-05 DIAGNOSIS — H43813 Vitreous degeneration, bilateral: Secondary | ICD-10-CM | POA: Diagnosis not present

## 2019-08-05 DIAGNOSIS — H3554 Dystrophies primarily involving the retinal pigment epithelium: Secondary | ICD-10-CM | POA: Diagnosis not present

## 2019-08-14 DIAGNOSIS — R972 Elevated prostate specific antigen [PSA]: Secondary | ICD-10-CM | POA: Diagnosis not present

## 2019-09-02 DIAGNOSIS — H3021 Posterior cyclitis, right eye: Secondary | ICD-10-CM | POA: Diagnosis not present

## 2019-09-02 DIAGNOSIS — H3554 Dystrophies primarily involving the retinal pigment epithelium: Secondary | ICD-10-CM | POA: Diagnosis not present

## 2019-09-02 DIAGNOSIS — H43813 Vitreous degeneration, bilateral: Secondary | ICD-10-CM | POA: Diagnosis not present

## 2019-09-02 DIAGNOSIS — H35351 Cystoid macular degeneration, right eye: Secondary | ICD-10-CM | POA: Diagnosis not present

## 2019-09-23 DIAGNOSIS — E349 Endocrine disorder, unspecified: Secondary | ICD-10-CM | POA: Diagnosis not present

## 2019-10-11 ENCOUNTER — Encounter (HOSPITAL_COMMUNITY): Payer: Self-pay | Admitting: Emergency Medicine

## 2019-10-11 ENCOUNTER — Emergency Department (HOSPITAL_COMMUNITY)
Admission: EM | Admit: 2019-10-11 | Discharge: 2019-10-11 | Disposition: A | Discharge status: Home / Self Care | Payer: Medicare Other | Attending: Emergency Medicine | Admitting: Emergency Medicine

## 2019-10-11 ENCOUNTER — Other Ambulatory Visit: Payer: Self-pay

## 2019-10-11 DIAGNOSIS — L03114 Cellulitis of left upper limb: Secondary | ICD-10-CM | POA: Diagnosis not present

## 2019-10-11 DIAGNOSIS — R2232 Localized swelling, mass and lump, left upper limb: Secondary | ICD-10-CM | POA: Diagnosis present

## 2019-10-11 HISTORY — DX: Cellulitis, unspecified: L03.90

## 2019-10-11 MED ORDER — SULFAMETHOXAZOLE-TRIMETHOPRIM 800-160 MG PO TABS: 1.0000 | ORAL_TABLET | Freq: Two times a day (BID) | ORAL | 0 refills | Status: DC

## 2019-10-11 MED ORDER — SULFAMETHOXAZOLE-TRIMETHOPRIM 800-160 MG PO TABS
2.0000 | ORAL_TABLET | Freq: Once | ORAL | Status: AC
  Administered 2019-10-11: 2 via ORAL
  Filled 2019-10-11: qty 2

## 2019-10-11 NOTE — ED Triage Notes (Signed)
Pt reports wound to left thumb with drainage. Pt now having increasing swelling in left thumb with itching. Pt has prior hx of cellulitis.

## 2019-10-11 NOTE — ED Provider Notes (Signed)
[facility] DEPT Provider Note   CSN: [id number] Arrival date & time: 10/11/19  2157     History Chief Complaint  Patient presents with  . cellulits    Joshua Glover is a 64 y.o. male.  Patient presents to the emergency department with concerns over possible cellulitis of left hand.  Patient reports that he noticed a small bump approximately 3 days ago.  It was a tiny blister that he squeezed and got some clear liquid and pus out of.  He has been using topical antibiotics and cleaning it daily.  He has noticed some increased redness and swelling at the site of the lesion at the base of the thumb as well as across the back of the hand tonight.  He has not had any fever.        Past Medical History:  Diagnosis Date  . Cellulitis     There are no problems to display for this patient.   History reviewed. No pertinent surgical history.     No family history on file.  Social History   Tobacco Use  . Smoking status: Never Smoker  . Smokeless tobacco: Never Used  Substance Use Topics  . Alcohol use: Never  . Drug use: Never    Home Medications Prior to Admission medications   Medication Sig Start Date End Date Taking? Authorizing Provider  sulfamethoxazole-trimethoprim (BACTRIM DS) 800-160 MG tablet Take 1 tablet by mouth 2 (two) times daily. 10/11/19   Orpah Greek, MD    Allergies    Patient has no known allergies.  Review of Systems   Review of Systems  Constitutional: Negative for fever.  Skin: Positive for wound.    Physical Exam Updated Vital Signs BP 139/86 (BP Location: Right Arm)   Pulse 72   Temp 98.5 F (36.9 C) (Oral)   Resp 18   SpO2 98%   Physical Exam Vitals and nursing note reviewed.  HENT:     Head: Normocephalic and atraumatic.  Musculoskeletal:     Left hand: Swelling (Dorsal aspect of hand) and tenderness (Dorsal aspect of hand at first MCP) present. No deformity. Normal range of motion.  Normal strength. Normal sensation. There is no disruption of two-point discrimination. Normal capillary refill.  Skin:    Findings: Erythema (Dorsal aspect of hand adjacent to first MCP joint area) and lesion (Small open area just proximal to first MCP joint draining a small amount of serous fluid with surrounding erythema, no induration or fluctuance) present.  Neurological:     General: No focal deficit present.     Mental Status: He is alert.     ED Results / Procedures / Treatments   Labs (all labs ordered are listed, but only abnormal results are displayed) Labs Reviewed - No data to display  EKG None  Radiology No results found.  Procedures Procedures (including critical care time)  Medications Ordered in ED Medications  sulfamethoxazole-trimethoprim (BACTRIM DS) 800-160 MG per tablet 2 tablet (2 tablets Oral Given 10/11/19 2347)    ED Course  I have reviewed the triage vital signs and the nursing notes.  Pertinent labs & imaging results that were available during my care of the patient were reviewed by me and considered in my medical decision making (see chart for details).    MDM Rules/Calculators/A&P                      Patient with a small open area on the back  of his hand proximal to the first MCP joint.  There is some serous fluid drainage with surrounding erythema but no signs of fluctuance or induration.  He does have some slight swelling across the back of the hand.  He has normal range of motion, no signs of joint involvement.  No pain with movement, no concern for tendon involvement.  Will treat empirically with Bactrim.  Patient counseled to return to the ER immediately if he has any worsening symptoms, also given contact information for hand surgery on call if needed.  Final Clinical Impression(s) / ED Diagnoses Final diagnoses:  None    Rx / DC Orders ED Discharge Orders         Ordered    sulfamethoxazole-trimethoprim (BACTRIM DS) 800-160 MG tablet   2 times daily     10/11/19 2347           Orpah Greek, MD 10/11/19 [phone]

## 2019-10-12 DIAGNOSIS — R972 Elevated prostate specific antigen [PSA]: Secondary | ICD-10-CM | POA: Diagnosis not present

## 2019-10-12 DIAGNOSIS — E349 Endocrine disorder, unspecified: Secondary | ICD-10-CM | POA: Diagnosis not present

## 2019-10-12 DIAGNOSIS — N401 Enlarged prostate with lower urinary tract symptoms: Secondary | ICD-10-CM | POA: Diagnosis not present

## 2019-10-12 DIAGNOSIS — R350 Frequency of micturition: Secondary | ICD-10-CM | POA: Diagnosis not present

## 2019-10-14 DIAGNOSIS — H35033 Hypertensive retinopathy, bilateral: Secondary | ICD-10-CM | POA: Diagnosis not present

## 2019-10-14 DIAGNOSIS — H3554 Dystrophies primarily involving the retinal pigment epithelium: Secondary | ICD-10-CM | POA: Diagnosis not present

## 2019-10-14 DIAGNOSIS — H43813 Vitreous degeneration, bilateral: Secondary | ICD-10-CM | POA: Diagnosis not present

## 2019-12-23 ENCOUNTER — Telehealth: Payer: Self-pay | Admitting: Urology

## 2019-12-23 NOTE — Telephone Encounter (Signed)
-----   Message from Festus Aloe, MD sent at 12/23/2019  2:25 PM EDT ----- Regarding: RE: LABS Let him know we will have to send labs that day since he is a new patient to [facility].  ----- Message ----- From: Benard Halsted Sent: 12/23/2019   2:16 PM EDT To: Festus Aloe, MD Subject: LABS                                           This patient called because he has an appointment with you in November, he is transferring care here from [facility]. He wanted to know if he needed to get his labs drawn here prior to his app? He is a new patient here so we would need orders for you to have this done. He has orders from [facility] but I can't use those. Please advise  Thanks Sharyn Lull

## 2019-12-23 NOTE — Telephone Encounter (Signed)
Patient had a question about labs prior to his app see below He has to wait until he has est care

## 2019-12-27 DIAGNOSIS — R42 Dizziness and giddiness: Secondary | ICD-10-CM | POA: Diagnosis not present

## 2019-12-27 DIAGNOSIS — H6980 Other specified disorders of Eustachian tube, unspecified ear: Secondary | ICD-10-CM | POA: Diagnosis not present

## 2019-12-30 DIAGNOSIS — H903 Sensorineural hearing loss, bilateral: Secondary | ICD-10-CM | POA: Diagnosis not present

## 2019-12-30 DIAGNOSIS — R42 Dizziness and giddiness: Secondary | ICD-10-CM | POA: Diagnosis not present

## 2020-01-15 DIAGNOSIS — H43813 Vitreous degeneration, bilateral: Secondary | ICD-10-CM | POA: Diagnosis not present

## 2020-01-15 DIAGNOSIS — H43392 Other vitreous opacities, left eye: Secondary | ICD-10-CM | POA: Diagnosis not present

## 2020-01-15 DIAGNOSIS — H3554 Dystrophies primarily involving the retinal pigment epithelium: Secondary | ICD-10-CM | POA: Diagnosis not present

## 2020-01-15 DIAGNOSIS — H35033 Hypertensive retinopathy, bilateral: Secondary | ICD-10-CM | POA: Diagnosis not present

## 2020-01-18 DIAGNOSIS — H903 Sensorineural hearing loss, bilateral: Secondary | ICD-10-CM | POA: Diagnosis not present

## 2020-02-02 DIAGNOSIS — M546 Pain in thoracic spine: Secondary | ICD-10-CM | POA: Diagnosis not present

## 2020-02-02 DIAGNOSIS — Z8249 Family history of ischemic heart disease and other diseases of the circulatory system: Secondary | ICD-10-CM | POA: Diagnosis not present

## 2020-02-02 DIAGNOSIS — Z0000 Encounter for general adult medical examination without abnormal findings: Secondary | ICD-10-CM | POA: Insufficient documentation

## 2020-02-02 DIAGNOSIS — F411 Generalized anxiety disorder: Secondary | ICD-10-CM | POA: Diagnosis not present

## 2020-02-02 DIAGNOSIS — M5489 Other dorsalgia: Secondary | ICD-10-CM | POA: Diagnosis not present

## 2020-02-02 DIAGNOSIS — E349 Endocrine disorder, unspecified: Secondary | ICD-10-CM | POA: Insufficient documentation

## 2020-02-19 ENCOUNTER — Other Ambulatory Visit: Payer: Self-pay | Admitting: Internal Medicine

## 2020-02-19 DIAGNOSIS — M549 Dorsalgia, unspecified: Secondary | ICD-10-CM

## 2020-02-22 ENCOUNTER — Other Ambulatory Visit: Payer: Self-pay

## 2020-02-22 ENCOUNTER — Ambulatory Visit
Admission: RE | Admit: 2020-02-22 | Discharge: 2020-02-22 | Disposition: A | Discharge status: Home / Self Care | Payer: Medicare Other | Source: Ambulatory Visit | Attending: Internal Medicine | Admitting: Internal Medicine

## 2020-02-22 DIAGNOSIS — M549 Dorsalgia, unspecified: Secondary | ICD-10-CM | POA: Insufficient documentation

## 2020-02-24 ENCOUNTER — Other Ambulatory Visit (HOSPITAL_COMMUNITY): Payer: Self-pay | Admitting: Unknown Physician Specialty

## 2020-02-24 ENCOUNTER — Other Ambulatory Visit: Payer: Self-pay | Admitting: Unknown Physician Specialty

## 2020-02-24 DIAGNOSIS — H35351 Cystoid macular degeneration, right eye: Secondary | ICD-10-CM | POA: Diagnosis not present

## 2020-02-24 DIAGNOSIS — H3554 Dystrophies primarily involving the retinal pigment epithelium: Secondary | ICD-10-CM | POA: Diagnosis not present

## 2020-02-24 DIAGNOSIS — R42 Dizziness and giddiness: Secondary | ICD-10-CM

## 2020-02-24 DIAGNOSIS — H35033 Hypertensive retinopathy, bilateral: Secondary | ICD-10-CM | POA: Diagnosis not present

## 2020-02-24 DIAGNOSIS — H903 Sensorineural hearing loss, bilateral: Secondary | ICD-10-CM | POA: Diagnosis not present

## 2020-03-02 DIAGNOSIS — Z23 Encounter for immunization: Secondary | ICD-10-CM | POA: Diagnosis not present

## 2020-03-03 DIAGNOSIS — R42 Dizziness and giddiness: Secondary | ICD-10-CM | POA: Diagnosis not present

## 2020-03-09 DIAGNOSIS — Z23 Encounter for immunization: Secondary | ICD-10-CM | POA: Diagnosis not present

## 2020-03-10 DIAGNOSIS — M5489 Other dorsalgia: Secondary | ICD-10-CM | POA: Diagnosis not present

## 2020-03-11 ENCOUNTER — Other Ambulatory Visit: Payer: Self-pay

## 2020-03-11 ENCOUNTER — Ambulatory Visit
Admission: RE | Admit: 2020-03-11 | Discharge: 2020-03-11 | Disposition: A | Discharge status: Home / Self Care | Payer: Medicare Other | Source: Ambulatory Visit | Attending: Unknown Physician Specialty | Admitting: Unknown Physician Specialty

## 2020-03-11 DIAGNOSIS — H9191 Unspecified hearing loss, right ear: Secondary | ICD-10-CM | POA: Diagnosis not present

## 2020-03-11 DIAGNOSIS — R42 Dizziness and giddiness: Secondary | ICD-10-CM | POA: Insufficient documentation

## 2020-03-11 MED ORDER — GADOBUTROL 1 MMOL/ML IV SOLN
7.5000 mL | Freq: Once | INTRAVENOUS | Status: AC | PRN
  Administered 2020-03-11: 7.5 mL via INTRAVENOUS

## 2020-03-29 ENCOUNTER — Telehealth: Payer: Self-pay | Admitting: Urology

## 2020-03-29 NOTE — Telephone Encounter (Signed)
Pt called office saying he is frustrated that it has taken him 6 mos to try to get an appt prior to his upcoming New pt appt and multiple phone calls to both [facility] and [facility] to get a lab appt prior and that he does not like that it has taken 6 mos and several phone calls so instead he will get labs drawn in [place] prior to each appt he has here since we can't seem to help him with a simple lab appt. He then stated someone at [facility] has given him an order for labs prior to his appt however it was explained to the patient that labs can't be done prior to a "New" pt appt. There is also a phone note in his chart from Dr. Junious Silk that also states he will wait until the day of his New pt appt to do any labs that are needed. It was then explained to the pt that [facility] lab can not accept orders from another clinic and that once he has had his first initial appt here at [facility] his labs prior to each appt will then be made prior with no issues and with ease. Pt then voiced understanding stating that no one else has taken the time to explain it that way to him.

## 2020-04-08 ENCOUNTER — Other Ambulatory Visit: Payer: Self-pay

## 2020-04-08 ENCOUNTER — Ambulatory Visit (INDEPENDENT_AMBULATORY_CARE_PROVIDER_SITE_OTHER): Payer: Medicare Other | Admitting: Urology

## 2020-04-08 VITALS — BP 162/85 | HR 100 | Ht 71.0 in | Wt 160.0 lb

## 2020-04-08 DIAGNOSIS — N401 Enlarged prostate with lower urinary tract symptoms: Secondary | ICD-10-CM | POA: Diagnosis not present

## 2020-04-08 DIAGNOSIS — R3912 Poor urinary stream: Secondary | ICD-10-CM | POA: Diagnosis not present

## 2020-04-08 DIAGNOSIS — R7989 Other specified abnormal findings of blood chemistry: Secondary | ICD-10-CM

## 2020-04-08 DIAGNOSIS — R972 Elevated prostate specific antigen [PSA]: Secondary | ICD-10-CM | POA: Diagnosis not present

## 2020-04-08 DIAGNOSIS — N138 Other obstructive and reflux uropathy: Secondary | ICD-10-CM | POA: Diagnosis not present

## 2020-04-08 NOTE — Progress Notes (Signed)
04/08/2020 1:23 PM   Joshua Glover November 08, 1955 [id number]  Referring provider: Aura Dials, [place],  [place] [address]  Chief Complaint  Patient presents with  . Establish Care    HPI:  Joshua Glover has been seen by urologist for many years and been seen in [place] since 2019.  He transferred care today and this is a brief summary of the scanned documents:  1) BPH-he has tried daily Cialis and alfuzosin in the past.  He was treated with Rezume water vapor therapy January 2021.  Lower urinary tract symptoms improved.  2) PSA elevation-PSA elevation for years around 5-9.  Biopsy in 2019 reported as benign.  Follow-up MRI August 2020 benign with a prostate of 62 g.  Last PSA was 4.74 March 2021.  3) erectile dysfunction-he has been treated with PDE 5 inhibitors.  Tried sildenafil.  Continue daily tadalafil.  4) low testosterone-he has been treated with low testosterone since 2003.  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  He currently gets testosterone compounded at customer care at 150 mg/mL and applies 3 clicks or 1.24 mL/day (112.5 mg / day). He tried 2 clicks (75 mg / day) but felt poorly.   Today, patient seen as a new patient at [facility] for the above. He is well.   PMH: Past Medical History:  Diagnosis Date  . Cellulitis     Surgical History: No past surgical history on file.  Home Medications:  Allergies as of 04/08/2020   No Known Allergies     Medication List       Accurate as of April 08, 2020  1:23 PM. If you have any questions, ask your nurse or doctor.        STOP taking these medications   sulfamethoxazole-trimethoprim 800-160 MG tablet Commonly known as: BACTRIM DS Stopped by: Festus Aloe, MD     TAKE these medications   alfuzosin 10 MG 24 hr tablet Commonly known as: UROXATRAL Take by mouth.   atorvastatin 10 MG tablet Commonly known as: LIPITOR Take 10 mg  by mouth at bedtime.   azelastine 0.1 % nasal spray Commonly known as: ASTELIN Place into the nose.   Calcium Carbonate-Vitamin D 600-400 MG-UNIT tablet Take by mouth.   Cholecalciferol 25 MCG (1000 UT) capsule Take by mouth.   magnesium gluconate 500 MG tablet Commonly known as: MAGONATE Take by mouth.   potassium chloride 10 MEQ tablet Commonly known as: KLOR-CON TAKE 1 TABLET BY MOUTH 3 TIMES A DAY WITH FOOD   Prolensa 0.07 % Soln Generic drug: Bromfenac Sodium PLACE 1 DROP IN RIGHT EYE TWICE A DAY FOR 1 WEEK THEN DAILY UNTIL FOLLOW UP   RA Fish Oil 1000 MG Caps Take by mouth.   risperiDONE 0.5 MG tablet Commonly known as: RISPERDAL Take by mouth.   tadalafil 5 MG tablet Commonly known as: CIALIS Take by mouth.       Allergies: No Known Allergies  Family History: No family history on file.  Social History:  reports that he has never smoked. He has never used smokeless tobacco. He reports that he does not drink alcohol and does not use drugs.   Physical Exam: BP (!) 162/85 (BP Location: Left Arm, Patient Position: Sitting, Cuff Size: Normal)   Pulse 100   Ht 5\' 11"  (1.803 m)   Wt 160 lb (72.6 kg)   BMI 22.32 kg/m   Constitutional:  Alert and oriented, No  acute distress. HEENT: [place] AT, moist mucus membranes.  Trachea midline, no masses. Cardiovascular: No clubbing, cyanosis, or edema. Respiratory: Normal respiratory effort, no increased work of breathing. GI: Abdomen is soft, nontender, nondistended, no abdominal masses GU: No CVA tenderness Skin: No rashes, bruises or suspicious lesions. Neurologic: Grossly intact, no focal deficits, moving all 4 extremities. Psychiatric: Normal mood and affect. DRE: prostate 60 grams and smooth without hard area or nodule   Laboratory Data: No results found for: WBC, HGB, HCT, MCV, PLT  No results found for: CREATININE  No results found for: PSA  No results found for: TESTOSTERONE  No results found for:  HGBA1C  Urinalysis No results found for: COLORURINE, APPEARANCEUR, LABSPEC, PHURINE, GLUCOSEU, HGBUR, BILIRUBINUR, KETONESUR, PROTEINUR, UROBILINOGEN, NITRITE, LEUKOCYTESUR  No results found for: LABMICR, [place], RBCUA, LABEPIT, MUCUS, BACTERIA  Pertinent Imaging: N/a  No results found for this or any previous visit.  No results found for this or any previous visit.  No results found for this or any previous visit.  No results found for this or any previous visit.  No results found for this or any previous visit.  No results found for this or any previous visit.  No results found for this or any previous visit.  No results found for this or any previous visit.   Assessment & Plan:    BPH - doing well after Rezume.   PSA - normal DRE today. PSA was sent.   ED - cont daily tadalafil (I just sent a year refill through [facility])   Low T - continue 112.5 mg / day   No follow-ups on file.  Festus Aloe, MD  [facility] [address], [place], [place] [address] [phone]

## 2020-04-09 LAB — HEMOGLOBIN AND HEMATOCRIT, BLOOD
Hematocrit: 49.3 % (ref 37.5–51.0)
Hemoglobin: 16.4 g/dL (ref 13.0–17.7)

## 2020-04-09 LAB — TESTOSTERONE,FREE AND TOTAL
Testosterone, Free: 7.9 pg/mL (ref 6.6–18.1)
Testosterone: 548 ng/dL (ref 264–916)

## 2020-04-09 LAB — PSA, TOTAL AND FREE
PSA, Free Pct: 14.2 %
PSA, Free: 0.64 ng/mL
Prostate Specific Ag, Serum: 4.5 ng/mL — ABNORMAL HIGH (ref 0.0–4.0)

## 2020-04-11 ENCOUNTER — Encounter: Payer: Self-pay | Admitting: Physical Therapy

## 2020-04-11 ENCOUNTER — Ambulatory Visit: Payer: Medicare Other | Attending: Unknown Physician Specialty | Admitting: Physical Therapy

## 2020-04-11 ENCOUNTER — Other Ambulatory Visit: Payer: Self-pay

## 2020-04-11 DIAGNOSIS — R42 Dizziness and giddiness: Secondary | ICD-10-CM | POA: Insufficient documentation

## 2020-04-11 NOTE — Therapy (Addendum)
[place] MAIN [facility] SERVICES [address] [place], [place], [address] Phone: [phone]   Fax:  [phone]  Physical Therapy Evaluation  Patient Details  Name: Joshua Glover MRN: [id number] Date of Birth: 1955-12-30 Referring Provider (PT): Dr. Beverly Gust   Encounter Date: 04/11/2020   PT End of Session - 04/11/20 1259    Visit Number 1    Number of Visits 8    Date for PT Re-Evaluation 06/06/20    PT Start Time 1300    PT Stop Time 1413    PT Time Calculation (min) 73 min    Equipment Utilized During Treatment Gait belt    Activity Tolerance Patient tolerated treatment well    Behavior During Therapy [facility] for tasks assessed/performed           Past Medical History:  Diagnosis Date  . Cellulitis     History reviewed. No pertinent surgical history.  There were no vitals filed for this visit.    Subjective Assessment - 04/11/20 1259    Subjective Patient reports that he has had two episodes of vertigo that lasted multiple hours.  The first vertiginous attack was August 1 and the second one was in mid September.  Patient reports that he has not had any further episodes of vertigo and feels that his balance has returned to normal.    Pertinent History History obtained from patient and medical record.  Patient reports sudden onset and acute attack of vertigo on December 27, 2019.  Patient reports he had never experienced vertigo prior to this date.  Patient reported that he felt off balance and that he tried to lie down for 8 to 9 hours, hydrated and took meclizine until his symptoms went away.  Patient reports he was seen by Dr. Tami Ribas on August 2.  Patient reports he had VNG testing performed and it did not show any problems and was normal.  Patient reports that they did canal testing for crystals but he was unable to describe whether he had had maneuvers performed or not.  Patient reports that he had his hearing tested 3 times at  the ENT office. Patient reports about 6 weeks later in mid-September, he had a second attack of vertigo while he was sitting at a table.  Patient reports he had a difficult time walking due to the dizziness at that time.  Patient states that after he laid down the vertigo sensation continued for hours. Patient reports that this second attack of vertigo also lasted hours.  Patient reports he has not had any further episodes of vertigo since mid-September.  Patient reports that he feels his balance is back to normal.  Patient reports nothing provokes his symptoms that he knows of and that resting, hydration and meclizine help to ease his symptoms of vertigo.  Patient describes his dizziness as off balance, unsteadiness, lightheadedness and vertigo.              [facility] PT Assessment - 04/11/20 1409      Assessment   Medical Diagnosis dizziness and giddiness    Referring Provider (PT) Dr. Beverly Gust    Onset Date/Surgical Date 12/27/19    Prior Therapy none      Precautions   Precautions None      Restrictions   Weight Bearing Restrictions No      Balance Screen   Has the patient fallen in the past 6 months No    Has the patient had a decrease  in activity level because of a fear of falling?  No    Is the patient reluctant to leave their home because of a fear of falling?  No      Home Environment   Living Environment Private residence    Living Arrangements Spouse/significant other    Available Help at Discharge Friend(s);Family      Cognition   Overall Cognitive Status Within Functional Limits for tasks assessed      Standardized Balance Assessment   Standardized Balance Assessment Dynamic Gait Index      Dynamic Gait Index   Level Surface Normal    Change in Gait Speed Normal    Gait with Horizontal Head Turns Normal    Gait with Vertical Head Turns Normal    Gait and Pivot Turn Normal    Step Over Obstacle Normal    Step Around Obstacles Normal    Steps Normal     Total Score 24            VESTIBULAR AND BALANCE EVALUATION   HISTORY:  Subjective history of current problem: History obtained from patient and medical record.  Patient reports sudden onset and acute attack of vertigo with nausea on December 27, 2019.  Patient reports he had never experienced vertigo prior to this date.  Patient reported that he felt off balance and that he tried to lie down for 8 to 9 hours, hydrated and took meclizine until his symptoms went away.  Patient reports he was seen by Dr. Tami Ribas on August 2.  Patient reports he had VNG testing performed and it did not show any problems and was normal.  Patient reports that they did canal testing for crystals, but he was unable to describe whether he had had maneuvers performed or not.  Patient reports that he had his hearing tested 3 times at the ENT office. Patient reports about 6 weeks later in mid-September, he had a second attack of vertigo while he was sitting at a table.  Patient reports he had a difficult time walking due to the dizziness at that time.  Patient states that after he laid down the vertigo sensation continued for hours. Patient reports that this second attack of vertigo also lasted hours.  Patient reports he has not had any further episodes of vertigo since mid-September.  Patient reports that he feels his balance is back to normal.  Patient reports nothing provokes his symptoms that he knows of and that resting, hydration and meclizine help to ease his symptoms of vertigo.  Patient describes his dizziness as off balance, unsteadiness, lightheadedness and vertigo.   Patient reports that he had pain in his shoulder and states he did not know if it was radiating from his spine and states he had cervical testing.   Symptom nature: spontaneous, intermittent Progression of symptoms: better History of similar episodes: no  Falls (yes/no): no Number of falls in past 6 months: 0  Auditory complaints (tinnitus, pain,  drainage): hearing aides; denies pain and drainage from ears Vision (last eye exam, diplopia, recent changes): denies vision changes; gets annual eye exams; patient wears glasses; patient reports he has had cataract surgery.  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 He has seen Dr. Corene Cornea 4 times this year for follow-up visits and trying different eye drops.  Current Symptoms: (dysarthria, dysphagia, drop attacks, bowel and bladder changes, recent weight loss/gain)  Review of systems negative for red  flags.    EXAMINATION  SOMATOSENSORY:  Any N & T in extremities or weakness: denies   COORDINATION: Finger to Nose: Normal Past Pointing:  Normal  MUSCULOSKELETAL SCREEN: Cervical Spine ROM: Cervical active range of motion within functional limits flexion, extension, left and right rotation.   Gait: Patient arrives ambulating without AD. Patient ambulates with good cadence with step through gait pattern with reciprocal arm swing with good scanning of the visual environment. Patient able to ambulate up down 4 steps without use of rail with step over step gait pattern independently.  Balance: Patient is challenged by feet together, uneven surfaces and eyes closed activities.  POSTURAL CONTROL TESTS:  Clinical Test of Sensory Interaction for Balance (CTSIB): CONDITION TIME STRATEGY SWAY  Eyes open, firm surface 30 seconds ankle   Eyes closed, firm surface 30 seconds ankle +1  Eyes open, foam surface 30 seconds ankle +1  Eyes closed, foam surface 30 seconds Ankle,hip +3    OCULOMOTOR / VESTIBULAR TESTING: Oculomotor Exam- Room Light  Normal Abnormal Comments  Ocular Alignment N    Ocular ROM N    Spontaneous Nystagmus N    Gaze evoked Nystagmus N    Smooth Pursuit N  Made patient mildly dizzy; noted one saccadic correction  Saccades N    VOR  Abn  patient reports mild dizziness and no blurring of the target  VOR  Cancellation N    Left Head Impulse N    Right Head Impulse N      BPPV TESTS:  Symptoms Duration Intensity Nystagmus  Left Dix-Hallpike None  N/A None observed  Right Dix-Hallpike None  N/A None observed  Left Head Roll None  N/A None observed  Right Head Roll None  N/A None observed    FUNCTIONAL OUTCOME MEASURES:  Results Comments  DHI  0/100  normal  ABC Scale  96.9%  normal, safe for community mobility  DGI  24/24  normal, safe for community mobility  FOTO  97/100 Given the patient's risk adjustment variables, like-patients nationally had a FS score of 58/100 at intake   VOR X 1 exercise:  Demonstrated and educated as to VOR X1.  Patient performed VOR X 1 horizontal in sitting 1 rep of 30 seconds and 2 reps of 1 minute each with verbal cues for technique.  Patient reports the target is staying in focus and reports mild dizziness.  Issued VOR x1 horizontal in sitting with 1 minute reps for home exercise program.  Handout provided.   Note: Sales executive used to create this note.    PT Education - 04/11/20 1259    Education Details Discussed plan of care, VOR X 1 horizontal in sitting for home exercise program    Person(s) Educated Patient    Methods Explanation;Demonstration;Verbal cues;Handout    Comprehension Verbalized understanding;Returned demonstration            PT Short Term Goals - 04/11/20 1824      PT SHORT TERM GOAL #1   Title Patient will be able to independently perform home exercise program for self-management and to help improve balance.    Time 4    Period Weeks    Status New    Target Date 05/09/20             PT Long Term Goals - 04/11/20 1825      PT LONG TERM GOAL #1   Title Patient reports no vertigo with provoking motions or positions.    Time 8  Period Weeks    Status New    Target Date 06/06/20      PT LONG TERM GOAL #2   Title Patient will report 50% or greater improvement in her symptoms of dizziness and imbalance with  provoking motions or positions    Time 8    Period Weeks    Status New    Target Date 06/06/20                  Plan - 04/11/20 1815    Clinical Impression Statement Patient presents to clinic with reports of 2 vertiginous episodes that lasted multiple hours with the first episode being on August 1 and 2 in mid September.  Patient reports that he has not had any further vertiginous episodes since mid-September.  Patient had negative left and right Dix-Hallpike and horizontal canal testing this date.  Patient reported mild dizziness with smooth pursuit and VOR testing and had otherwise normal oculomotor examination.  On the modified CTSIB test, patient had +3 sway with eyes closed on foam surface with feet together.  Patient scored 24/24 on the DGI and scored 96.9% on the ABC scale indicating low falls risk and safe for community mobility.  Patient scored 0/100 on the dizziness handicap inventory and 97/100 on FOTO indicating high level of function on activities of daily living. Will attempt activties to try to recreate patient's dizziness and high level balance activities next session. Patient would benefit from vestiublar therapy to address functional deficits and goals as set on plan of care.    Personal Factors and Comorbidities Comorbidity 1    Comorbidities Anxiety    Stability/Clinical Decision Making Stable/Uncomplicated    Clinical Decision Making Low    Rehab Potential Good    PT Frequency 1x / week    PT Duration 8 weeks    PT Treatment/Interventions Canalith Repostioning;Therapeutic activities;Therapeutic exercise;Balance training;Neuromuscular re-education;Vestibular           Patient will benefit from skilled therapeutic intervention in order to improve the following deficits and impairments:  Decreased balance, Dizziness  Visit Diagnosis: Dizziness and giddiness     Problem List Patient Active Problem List   Diagnosis Date Noted  . Anxiety, generalized  02/02/2020  . Family history of abdominal aortic aneurysm 02/02/2020  . Healthcare maintenance 02/02/2020  . Testosterone deficiency 02/02/2020   Lady Deutscher PT, DPT [phone] Lady Deutscher 04/11/2020, 6:49 PM  [place] MAIN [facility] SERVICES [address] [place], [place], [address] Phone: [phone]   Fax:  [phone]  Name: Trejuan Matherne MRN: [id number] Date of Birth: 1955/09/29

## 2020-04-12 LAB — TESTOSTERONE, BIOAVAILABLE (M)
Testost., % Free&Weakly Bound: 17 % (ref 9.0–46.0)
Testost., F&W Bound: 120.4 ng/dL (ref 40.0–250.0)
Testosterone: 708 ng/dL (ref 264–916)

## 2020-04-14 ENCOUNTER — Telehealth: Payer: Self-pay

## 2020-04-14 NOTE — Telephone Encounter (Signed)
Patient notified and voiced understanding.

## 2020-04-14 NOTE — Telephone Encounter (Signed)
No DPR on file. LMOM asking patient to return call. See result note.

## 2020-04-14 NOTE — Telephone Encounter (Signed)
-----   Message from Festus Aloe, MD sent at 04/13/2020  2:43 PM EST ----- Let Joshua Glover know his testosterone (free and total) look great and I does need a specific "bioavailable" level. It will not change what we do. The levels look good. Thanks.  ----- Message ----- From: Chrystie Nose, CMA Sent: 04/12/2020   1:47 PM EST To: Festus Aloe, MD   ----- Message ----- From: Interface, [facility] Lab Results In Sent: 04/09/2020   7:37 AM EST To: Rowe Robert Clinical

## 2020-04-18 ENCOUNTER — Encounter: Payer: Self-pay | Admitting: Physical Therapy

## 2020-04-18 ENCOUNTER — Other Ambulatory Visit: Payer: Self-pay

## 2020-04-18 ENCOUNTER — Ambulatory Visit: Payer: Medicare Other | Admitting: Physical Therapy

## 2020-04-18 DIAGNOSIS — R42 Dizziness and giddiness: Secondary | ICD-10-CM | POA: Diagnosis not present

## 2020-04-18 NOTE — Therapy (Signed)
[place] MAIN [facility] SERVICES [address] [place], [place], [address] Phone: [phone]   Fax:  [phone]  Physical Therapy Treatment  Patient Details  Name: Joshua Glover MRN: [id number] Date of Birth: 08-29-1955 Referring Provider (PT): Dr. Beverly Gust   Encounter Date: 04/18/2020   PT End of Session - 04/18/20 1349    Visit Number 2    Number of Visits 8    Date for PT Re-Evaluation 06/06/20    PT Start Time 9326    PT Stop Time 1450    PT Time Calculation (min) 61 min    Equipment Utilized During Treatment Gait belt    Activity Tolerance Patient tolerated treatment well    Behavior During Therapy [facility] for tasks assessed/performed           Past Medical History:  Diagnosis Date  . Cellulitis     History reviewed. No pertinent surgical history.  There were no vitals filed for this visit.   Neuromuscular Re-education:  Patient reports that he does get dizziness for about 15-20 seconds after doing the VOR X 1 exercises when he performs these at home.  VOR X 1 exercise:  Patient performed VOR x1 horizontal in sitting 2 reps of 1 minute each. Patient reports mild dizziness rated 1-2/10 with this activity. Patient performed VOR x1 horizontal in standing with plain background and then with busy background 1 rep of 1 minute each. Patient reports no difference in dizziness with busy background. Added conflicting background in standing progressions to VOR x1.  Airex balance beam: On Airex balance beam, performed static sideways stance with horizontal and vertical head turns with eyes open and then with eyes closed multiple 30-60 seconds repetitions. On Airex balance beam, perform static sideways stance with body turns multiple 30 to 60 seconds repetitions. On Airex balance beam performed sidestepping with horizontal and then vertical head turns 5 feet times multiple reps. On Airex balance beam, performed tandem static  standing with eyes open and then with eyes closed multiple reps of 30 to 60 seconds. Patient required contact-guard assist with above activities.  Patient's balance was challenged by above activities and patient demonstrated ankle and some hip strategies. Added feet together and set semitandem progressions with horizontal and vertical head turns and body turns while standing on pillow for home exercise program.  Body Wall Rolls:  Patient performed 3 reps of supported body wall rolls with eyes open and then performed 3 reps of supported body wall rolls with eyes closed with contact-guard assist. Patient denies dizziness with this activity.  Ambulation with head turns: Performed multiple 125 foot forward and retroambulation with horizontal and vertical head turns with contact-guard assist. Patient denies dizziness with this activity and no veering or uneven steppage was noted.     PT Education - 04/18/20 1826    Education Details discussed functional outcome test results from initial evaluation; issued Feet together and semi-tandem progressions with horizontal and vertical head turns and body turns and added conflicting background progresison to VOR X 1 exercise for home exercise program    Person(s) Educated Patient    Methods Explanation;Handout;Verbal cues;Demonstration    Comprehension Verbalized understanding;Returned demonstration            PT Short Term Goals - 04/11/20 1824      PT SHORT TERM GOAL #1   Title Patient will be able to independently perform home exercise program for self-management and to help improve balance.    Time 4  Period Weeks    Status New    Target Date 05/09/20             PT Long Term Goals - 04/11/20 1825      PT LONG TERM GOAL #1   Title Patient reports no vertigo with provoking motions or positions.    Time 8    Period Weeks    Status New    Target Date 06/06/20      PT LONG TERM GOAL #2   Title Patient will report 50% or greater  improvement in her symptoms of dizziness and imbalance with provoking motions or positions    Time 8    Period Weeks    Status New    Target Date 06/06/20                 Plan - 04/18/20 1842    Clinical Impression Statement Patient reports no further episodes of vertigo.  Patient reports that he gets 15 to 20 seconds of dizziness after performing VOR x1 exercise at home, but otherwise, reports no further dizziness.  Patient able to advance to VOR x1 in standing with conflicting background this date and added this progression to home exercise program.  Patient performed body wall rolls and ambulation with head turns and neither of these activities recreated any dizziness and patient demonstrated good balance during these activities.  Patient's balance was challenged by feet together and semitandem progressions with and without head turns and body turns and eyes open/eyes closed activities on Airex balance beam.  Patient would benefit from continued PT services to further address goals and functional deficits.    Personal Factors and Comorbidities Comorbidity 1    Comorbidities Anxiety    Stability/Clinical Decision Making Stable/Uncomplicated    Rehab Potential Good    PT Frequency 1x / week    PT Duration 8 weeks    PT Treatment/Interventions Canalith Repostioning;Therapeutic activities;Therapeutic exercise;Balance training;Neuromuscular re-education;Vestibular           Patient will benefit from skilled therapeutic intervention in order to improve the following deficits and impairments:  Decreased balance, Dizziness  Visit Diagnosis: Dizziness and giddiness     Problem List Patient Active Problem List   Diagnosis Date Noted  . Anxiety, generalized 02/02/2020  . Family history of abdominal aortic aneurysm 02/02/2020  . Healthcare maintenance 02/02/2020  . Testosterone deficiency 02/02/2020   Lady Deutscher PT, DPT [phone] Lady Deutscher 04/18/2020, 6:45 PM  [place] MAIN [facility] [address] [place], [place], [address] Phone: [phone]   Fax:  [phone]  Name: Joshua Glover MRN: [id number] Date of Birth: Apr 02, 1956

## 2020-04-25 ENCOUNTER — Other Ambulatory Visit: Payer: Self-pay

## 2020-04-25 ENCOUNTER — Encounter: Payer: Self-pay | Admitting: Physical Therapy

## 2020-04-25 ENCOUNTER — Ambulatory Visit: Payer: Medicare Other | Admitting: Physical Therapy

## 2020-04-25 DIAGNOSIS — R42 Dizziness and giddiness: Secondary | ICD-10-CM | POA: Diagnosis not present

## 2020-04-25 NOTE — Therapy (Signed)
[place] MAIN [facility] SERVICES [address] [place], [place], [address] Phone: [phone]   Fax:  [phone]  Physical Therapy Treatment  Patient Details  Name: Joshua Glover MRN: [id number] Date of Birth: 06-14-1955 Referring Provider (PT): Dr. Beverly Gust   Encounter Date: 04/25/2020   PT End of Session - 04/25/20 1345    Visit Number 3    Number of Visits 8    Date for PT Re-Evaluation 06/06/20    PT Start Time 1344    PT Stop Time 1435    PT Time Calculation (min) 51 min    Equipment Utilized During Treatment Gait belt    Activity Tolerance Patient tolerated treatment well    Behavior During Therapy [facility] for tasks assessed/performed           Past Medical History:  Diagnosis Date  . Cellulitis     History reviewed. No pertinent surgical history.  There were no vitals filed for this visit.   Subjective Assessment - 04/25/20 1512    Subjective Patient reports that he has been doing his home exercise program. Patient reports no further episodes of vertigo.    Pertinent History History obtained from patient and medical record.  Patient reports sudden onset and acute attack of vertigo on December 27, 2019.  Patient reports he had never experienced vertigo prior to this date.  Patient reported that he felt off balance and that he tried to lie down for 8 to 9 hours, hydrated and took meclizine until his symptoms went away.  Patient reports he was seen by Dr. Tami Ribas on August 2.  Patient reports he had VNG testing performed and it did not show any problems and was normal.  Patient reports that they did canal testing for crystals but he was unable to describe whether he had had maneuvers performed or not.  Patient reports that he had his hearing tested 3 times at the ENT office. Patient reports about 6 weeks later in mid-September, he had a second attack of vertigo while he was sitting at a table.  Patient reports he had a difficult time  walking due to the dizziness at that time.  Patient states that after he laid down the vertigo sensation continued for hours. Patient reports that this second attack of vertigo also lasted hours.  Patient reports he has not had any further episodes of vertigo since mid-September.  Patient reports that he feels his balance is back to normal.  Patient reports nothing provokes his symptoms that he knows of and that resting, hydration and meclizine help to ease his symptoms of vertigo.  Patient describes his dizziness as off balance, unsteadiness, lightheadedness and vertigo.    Diagnostic tests VNG: per medical record, showed Right weakness of 62%.           Neuromuscular Re-education:  VOR X 1 exercise:  Patient performed standing on Airex pad, VOR X 1 horizontal in standing with conflicting background 1 rep of horizontal and 1 rep of vertical 1 minute each with verbal cues initially for technique as patient was not keeping his eyes on the target and was moving his head with too much excursion.  Performed 50' times multiple reps walking VOR X 1 horizontal with conflicting background.  Patient denies dizziness with these activities.    Diona Foley toss over shoulder: Patient performed multiple 175' trials of forward and retro ambulation while tossing ball over one shoulder with return catch over opposite shoulder with CGA.  Patient performed  multiple 175' trials of forward and retro ambulation while tossing ball over one shoulder with return catch over opposite shoulder varying the ball position to head, shoulder and waist level to promote head turning and tilting. Patient demonstrated fair cadence and no uneven steppage or veering with this activity and patient reports no dizziness.  Ambulation with head turns:  Patient performed 175' trials of forwards and retro ambulation with diagonal head turns and then random head turns with CGA.   Patient demonstrates mild veering initially with retro ambulation but  improved with practice. Patient denied dizziness.    PT Education - 04/25/20 1345    Education Details Discussd home exercise program and added progression of standing on pillow with conflicting background for VOR X1 and walking with conflicting background VOR X1, and feet together and semi-tandem progressions with diagonal head turns. Provided Vestibular Injury handout from [email or web address].org    Person(s) Educated Patient    Methods Explanation;Demonstration;Verbal cues;Handout    Comprehension Verbalized understanding;Returned demonstration            PT Short Term Goals - 04/11/20 1824      PT SHORT TERM GOAL #1   Title Patient will be able to independently perform home exercise program for self-management and to help improve balance.    Time 4    Period Weeks    Status New    Target Date 05/09/20             PT Long Term Goals - 04/11/20 1825      PT LONG TERM GOAL #1   Title Patient reports no vertigo with provoking motions or positions.    Time 8    Period Weeks    Status New    Target Date 06/06/20      PT LONG TERM GOAL #2   Title Patient will report 50% or greater improvement in her symptoms of dizziness and imbalance with provoking motions or positions    Time 8    Period Weeks    Status New    Target Date 06/06/20                 Plan - 04/25/20 1518    Clinical Impression Statement Patient reports compliance with home exericse program and demonstrates good motivation in the clinic. Patient able to progress to walking VOR X 1 with conflicting background, standing on Airex pad with conflicting background VOR X1 and added diagonal head turns to semi-tandem and feet together progressions for home exercise program. Patient with mild veering noted with retro ambluation with random head turns but improved with practice. Will plan on trying rockerboard with head turns, half foam roll activities and Airex balance beam activities with eyes open and closed next  session. Patient would benefit from continued PT services to further address goals and functional deficits.    Personal Factors and Comorbidities Comorbidity 1    Comorbidities Anxiety    Stability/Clinical Decision Making Stable/Uncomplicated    Rehab Potential Good    PT Frequency 1x / week    PT Duration 8 weeks    PT Treatment/Interventions Canalith Repostioning;Therapeutic activities;Therapeutic exercise;Balance training;Neuromuscular re-education;Vestibular    PT Home Exercise Plan VOR x 1 in standing on compiant surface wit conflicting background 1 minute reps and walking VOR X 1 with conflicting background and feet together and semi-tandem progressions with horizontal, vertical and diagonal head turns.    Consulted and Agree with Plan of Care Patient  Patient will benefit from skilled therapeutic intervention in order to improve the following deficits and impairments:  Decreased balance, Dizziness  Visit Diagnosis: Dizziness and giddiness     Problem List Patient Active Problem List   Diagnosis Date Noted  . Anxiety, generalized 02/02/2020  . Family history of abdominal aortic aneurysm 02/02/2020  . Healthcare maintenance 02/02/2020  . Testosterone deficiency 02/02/2020   Lady Deutscher PT, DPT [phone] Lady Deutscher 04/25/2020, 3:30 PM  [place] MAIN [facility] SERVICES [address] [place], [place], [address] Phone: [phone]   Fax:  [phone]  Name: Joshua Glover MRN: [id number] Date of Birth: [birth year]

## 2020-05-04 ENCOUNTER — Encounter: Payer: Self-pay | Admitting: Physical Therapy

## 2020-05-04 ENCOUNTER — Other Ambulatory Visit: Payer: Self-pay

## 2020-05-04 ENCOUNTER — Ambulatory Visit: Payer: Medicare Other | Attending: Unknown Physician Specialty | Admitting: Physical Therapy

## 2020-05-04 DIAGNOSIS — R42 Dizziness and giddiness: Secondary | ICD-10-CM | POA: Insufficient documentation

## 2020-05-04 NOTE — Therapy (Signed)
[place] MAIN [facility] SERVICES [address] [place], [place], [address] Phone: [phone]   Fax:  [phone]  Physical Therapy Treatment  Patient Details  Name: Joshua Glover MRN: [id number] Date of Birth: 10-Dec-1955 Referring Provider (PT): Dr. Beverly Gust   Encounter Date: 05/04/2020   PT End of Session - 05/04/20 0956    Visit Number 4    Number of Visits 8    Date for PT Re-Evaluation 06/06/20    PT Start Time 0957    PT Stop Time 1059    PT Time Calculation (min) 62 min    Equipment Utilized During Treatment Gait belt    Activity Tolerance Patient tolerated treatment well    Behavior During Therapy [facility] for tasks assessed/performed           Past Medical History:  Diagnosis Date  . Cellulitis     History reviewed. No pertinent surgical history.  There were no vitals filed for this visit.   Subjective Assessment - 05/04/20 0956    Subjective Patient reports no further episodes of dizziness. Patient states he did not do his exercises this week, but did them a few times the week before. Patient states he does not have any motivation to do the exercises. Encouragement provided and educated as to importance of home exercise program.    Pertinent History History obtained from patient and medical record.  Patient reports sudden onset and acute attack of vertigo on December 27, 2019.  Patient reports he had never experienced vertigo prior to this date.  Patient reported that he felt off balance and that he tried to lie down for 8 to 9 hours, hydrated and took meclizine until his symptoms went away.  Patient reports he was seen by Dr. Tami Ribas on August 2.  Patient reports he had VNG testing performed and it did not show any problems and was normal.  Patient reports that they did canal testing for crystals but he was unable to describe whether he had had maneuvers performed or not.  Patient reports that he had his hearing tested 3 times at  the ENT office. Patient reports about 6 weeks later in mid-September, he had a second attack of vertigo while he was sitting at a table.  Patient reports he had a difficult time walking due to the dizziness at that time.  Patient states that after he laid down the vertigo sensation continued for hours. Patient reports that this second attack of vertigo also lasted hours.  Patient reports he has not had any further episodes of vertigo since mid-September.  Patient reports that he feels his balance is back to normal.  Patient reports nothing provokes his symptoms that he knows of and that resting, hydration and meclizine help to ease his symptoms of vertigo.  Patient describes his dizziness as off balance, unsteadiness, lightheadedness and vertigo.    Diagnostic tests VNG: per medical record, showed Right weakness of 62%.           Neuromuscular Re-education:  1/2 Foam Roll:  On 1/2 foam roll with flat side down, worked on static holds with horizontal and vertical head turns with eyes open and then with eyes closed multiple 30 second trials. On half foam roll with flat side down, worked on sidestepping with vertical and then horizontal head turns multiple reps of 6 feet each. On half foam roll with round side down, worked on static holds with horizontal and vertical head turns with eyes open and then  with eyes closed multiple 30 second trials Patient requiring assistance ranging from CGA to mod/min A.  Patient using ankle, hip and a few times reaching strategies to regain balance.   Patient had increased difficulty with all activities with eyes closed.  Rockerboard: On medium wooden rocker board, worked on side to side and anterior/posterior sways with and without horizontal and vertical head turns with contact-guard assistance. On medium wooden rocker board, worked on side to side sways with horizontal head turns with eyes closed with contact-guard to min assistance.  This activity was very  challenging for patient.  Ambulation with head turns:  Patient performed 150' trials of retro ambulation with self-selected head turns including horizontal, vertical and diagonals with CGA.  Patient did better this date and was able to maintain fair walking speed and step length with no uneven steppage.  Ball tracking: Patient performed retro ambulation while holding ball while moving ball horizontally, vertically, diagonals and in circles while tracking ball with head and eyes with verbal cues to turn the head with contact-guard assistance. Patient demonstrated fair walking speed and step length with no uneven steppage and no losses of balance.  Discussed safety precautions with performing HEP at home. Demonstrated and discussed standing in corner with chair in front for safety and then patient demonstrated. Patient performed heel toe stance with horizontal and vertical head turns with eyes open and then with eyes closed with alternating lead foot while standing and corner with chair in front.  Patient tends to only lose balance towards right side with this activity.  Slow Marching: Patient performed on firm surface slow marching with eyes open with 5-second holds about 5 reps each leg. Patient demonstrates increased difficulty with standing on right leg as compared to left leg. Added this exercise to home exercise program with 5-second holds with 15-20 reps each leg standing and corner.     PT Education - 05/04/20 0956    Education Details Discussed home exercise program and discussed importance of performing HEP. Added progressions of feet together and semi-tandem standing on pillow with body turns and head turns standing in corner with chair in front for safety with eyes open and with eyes closed. Added slow marching with 5 second holds to HEP. Discussed and demonstrated performing all eyes closed activitites standing in corner with chair in fromt for safety.    Person(s) Educated Patient     Methods Explanation;Demonstration;Handout;Verbal cues    Comprehension Verbalized understanding;Returned demonstration            PT Short Term Goals - 04/11/20 1824      PT SHORT TERM GOAL #1   Title Patient will be able to independently perform home exercise program for self-management and to help improve balance.    Time 4    Period Weeks    Status New    Target Date 05/09/20             PT Long Term Goals - 04/11/20 1825      PT LONG TERM GOAL #1   Title Patient reports no vertigo with provoking motions or positions.    Time 8    Period Weeks    Status New    Target Date 06/06/20      PT LONG TERM GOAL #2   Title Patient will report 50% or greater improvement in her symptoms of dizziness and imbalance with provoking motions or positions    Time 8    Period Weeks    Status New  Target Date 06/06/20                 Plan - 05/04/20 1323    Clinical Impression Statement Patient reports no episodes of dizziness since last being seen in the clinic and reports that he only did the home exercise program a few times.  Reinforced and reeducated as to importance of home exercise program.  Worked on a variety of high-level static and dynamic balance activities incorporating head turns and with eyes open and eyes closed this date.  Patient challenged by half foam roll and rocker board activities especially with eyes closed, but improved with practice.  Repeated ambulation with random head turns this date and patient did well.  Patient challenged by slow marching activity and single-leg stance.  Added slow marching exercise to home exercise program and added progression of eyes closed trials to the feet together and semitandem progressions with head turns while standing in corner with chair in front for safety for home.  Anticipate repeating functional outcome measures within the next 2 sessions and working on progression of high-level vestibular balance activities.  Patient  would benefit from continued vestibular PT services to further address goals and functional deficits.    Personal Factors and Comorbidities Comorbidity 1    Comorbidities Anxiety    Stability/Clinical Decision Making Stable/Uncomplicated    Rehab Potential Good    PT Frequency 1x / week    PT Duration 8 weeks    PT Treatment/Interventions Canalith Repostioning;Therapeutic activities;Therapeutic exercise;Balance training;Neuromuscular re-education;Vestibular    PT Home Exercise Plan VOR x 1 in standing on compiant surface wit conflicting background 1 minute reps and walking VOR X 1 with conflicting background and feet together and semi-tandem progressions with horizontal, vertical and diagonal head turns.    Consulted and Agree with Plan of Care Patient           Patient will benefit from skilled therapeutic intervention in order to improve the following deficits and impairments:  Decreased balance, Dizziness  Visit Diagnosis: Dizziness and giddiness     Problem List Patient Active Problem List   Diagnosis Date Noted  . Anxiety, generalized 02/02/2020  . Family history of abdominal aortic aneurysm 02/02/2020  . Healthcare maintenance 02/02/2020  . Testosterone deficiency 02/02/2020   Lady Deutscher PT, DPT [phone] Lady Deutscher 05/04/2020, 1:26 PM  [place] MAIN [facility] [address] [place], [place], [address] Phone: [phone]   Fax:  [phone]  Name: Joshua Glover MRN: [id number] Date of Birth: 1956-05-21

## 2020-05-05 ENCOUNTER — Encounter: Payer: Medicare Other | Admitting: Physical Therapy

## 2020-05-09 ENCOUNTER — Ambulatory Visit: Payer: Medicare Other | Admitting: Physical Therapy

## 2020-05-09 ENCOUNTER — Encounter: Payer: Self-pay | Admitting: Physical Therapy

## 2020-05-09 ENCOUNTER — Other Ambulatory Visit: Payer: Self-pay

## 2020-05-09 DIAGNOSIS — R42 Dizziness and giddiness: Secondary | ICD-10-CM

## 2020-05-09 NOTE — Therapy (Signed)
[place] MAIN [facility] SERVICES [address] [place], [place], [address] Phone: [phone]   Fax:  [phone]  Physical Therapy Treatment/Discharge Summary Dates of service: 04/11/2020-05/09/2020 Total number of visits: 5  Patient Details  Name: Joshua Glover MRN: [id number] Date of Birth: 10/03/1955 Referring Provider (PT): Dr. Beverly Gust   Encounter Date: 05/09/2020   PT End of Session - 05/09/20 1344    Visit Number 5    Number of Visits 8    Date for PT Re-Evaluation 06/06/20    PT Start Time 1344    PT Stop Time 1430    PT Time Calculation (min) 46 min    Equipment Utilized During Treatment Gait belt    Activity Tolerance Patient tolerated treatment well    Behavior During Therapy [facility] for tasks assessed/performed           Past Medical History:  Diagnosis Date  . Cellulitis     History reviewed. No pertinent surgical history.  There were no vitals filed for this visit.   Subjective Assessment - 05/09/20 1344    Subjective Patient states he did his home exercise program this past week 5 out of 6 days for 15 minutes each. Patient states that he has a difficult time with eyes closed activities. Patient reports his balance feels good when he is doing table tennis.    Pertinent History History obtained from patient and medical record.  Patient reports sudden onset and acute attack of vertigo on December 27, 2019.  Patient reports he had never experienced vertigo prior to this date.  Patient reported that he felt off balance and that he tried to lie down for 8 to 9 hours, hydrated and took meclizine until his symptoms went away.  Patient reports he was seen by Dr. Tami Ribas on August 2.  Patient reports he had VNG testing performed and it did not show any problems and was normal.  Patient reports that they did canal testing for crystals but he was unable to describe whether he had had maneuvers performed or not.  Patient reports that  he had his hearing tested 3 times at the ENT office. Patient reports about 6 weeks later in mid-September, he had a second attack of vertigo while he was sitting at a table.  Patient reports he had a difficult time walking due to the dizziness at that time.  Patient states that after he laid down the vertigo sensation continued for hours. Patient reports that this second attack of vertigo also lasted hours.  Patient reports he has not had any further episodes of vertigo since mid-September.  Patient reports that he feels his balance is back to normal.  Patient reports nothing provokes his symptoms that he knows of and that resting, hydration and meclizine help to ease his symptoms of vertigo.  Patient describes his dizziness as off balance, unsteadiness, lightheadedness and vertigo.    Diagnostic tests VNG: per medical record, showed Right weakness of 62%.           Neuromuscular Re-education:   Clinical Test of Sensory Interaction for Balance  (CTSIB): CONDITION TIME STRATEGY SWAY  Eyes open, firm surface 30 seconds  ankle +1  Eyes closed, firm surface 30 seconds  ankle +1  Eyes open, foam surface 30 seconds  ankle +1  Eyes closed, foam surface 30 seconds  ankle, hip +3     FUNCTIONAL OUTCOME MEASURES:  Results Comments  DHI  2/100  low perception of handicap; normal  ABC Scale  93.8%  safe for community mobility, decreased falls risk  FOTO   97/100  demonstrating good overall ability with daily functional activities   Discussed progress towards goals and functional outcome test measures.  Discussed home exercise program and reviewed with patient.  Patient has copy of his home exercise program and has no questions.  Discussed discharge plans and patient in agreement with discharge.  Encourage patient to continue to perform home exercise program and/or continuing to perform table tennis and tennis activities as this requires a lot of head movement and eye tracking.  Note:  This document was  prepared using Dragon voice recognition software and may include unintentional dictation errors    PT Short Term Goals - 05/09/20 1420      PT SHORT TERM GOAL #1   Title Patient will be able to independently perform home exercise program for self-management and to help improve balance.    Time 4    Period Weeks    Status Achieved    Target Date 05/09/20             PT Long Term Goals - 05/09/20 1409      PT LONG TERM GOAL #1   Title Patient reports no vertigo with provoking motions or positions.    Baseline Patient reports no further episodes of vertigo    Time 8    Period Weeks    Status Achieved      PT LONG TERM GOAL #2   Title Patient will report 50% or greater improvement in her symptoms of dizziness and imbalance with provoking motions or positions    Baseline reports no improvement as he states he has not had any further episodes of vertigo and states he has not noticed any change in his balance when doing higher level activities such as table tennis.    Time 8    Period Weeks    Status Not Met                 Plan - 05/09/20 1741    Clinical Impression Statement Repeated functional outcome testing this date and compared to prior test results.  Discussed home exercise program and reviewed with patient. Patient has copy of his home exercise program and has no questions.  Patient has met 1 short-term goal of independence with home exercise program.  Patient has met 1/2 long-term goal as patient reports no vertigo with provoking movements or positions and did not meet remaining long-term goal of reporting 50% or greater improvement in overall symptoms of dizziness and imbalance.  Discussed discharge plans and patient in agreement with discharge.  Encourage patient to continue to perform home exercise program and/or continuing to perform table tennis and tennis activities as this requires a lot of head movement and eye tracking.  Will discharge patient from vestibular PT  services at this time.    Personal Factors and Comorbidities Comorbidity 1    Comorbidities Anxiety    Stability/Clinical Decision Making Stable/Uncomplicated    Rehab Potential Good    PT Frequency 1x / week    PT Duration 8 weeks    PT Treatment/Interventions Canalith Repostioning;Therapeutic activities;Therapeutic exercise;Balance training;Neuromuscular re-education;Vestibular    PT Home Exercise Plan VOR x 1 in standing on compiant surface wit conflicting background 1 minute reps and walking VOR X 1 with conflicting background and feet together and semi-tandem progressions with horizontal, vertical and diagonal head turns.    Consulted and Agree with Plan of Care Patient  Patient will benefit from skilled therapeutic intervention in order to improve the following deficits and impairments:  Decreased balance,Dizziness  Visit Diagnosis: Dizziness and giddiness     Problem List Patient Active Problem List   Diagnosis Date Noted  . Anxiety, generalized 02/02/2020  . Family history of abdominal aortic aneurysm 02/02/2020  . Healthcare maintenance 02/02/2020  . Testosterone deficiency 02/02/2020   Lady Deutscher PT, DPT [phone] Lady Deutscher 05/09/2020, 5:44 PM  [place] MAIN [facility] SERVICES [address] [place], [place], [address] Phone: [phone]   Fax:  [phone]  Name: Kordell Jafri MRN: [id number] Date of Birth: June 22, 1955

## 2020-05-11 DIAGNOSIS — L814 Other melanin hyperpigmentation: Secondary | ICD-10-CM | POA: Diagnosis not present

## 2020-05-11 DIAGNOSIS — D485 Neoplasm of uncertain behavior of skin: Secondary | ICD-10-CM | POA: Diagnosis not present

## 2020-05-11 DIAGNOSIS — D2271 Melanocytic nevi of right lower limb, including hip: Secondary | ICD-10-CM | POA: Diagnosis not present

## 2020-05-11 DIAGNOSIS — Z85820 Personal history of malignant melanoma of skin: Secondary | ICD-10-CM | POA: Diagnosis not present

## 2020-05-11 DIAGNOSIS — L570 Actinic keratosis: Secondary | ICD-10-CM | POA: Diagnosis not present

## 2020-05-11 DIAGNOSIS — D2272 Melanocytic nevi of left lower limb, including hip: Secondary | ICD-10-CM | POA: Diagnosis not present

## 2020-05-11 DIAGNOSIS — D225 Melanocytic nevi of trunk: Secondary | ICD-10-CM | POA: Diagnosis not present

## 2020-05-11 DIAGNOSIS — L821 Other seborrheic keratosis: Secondary | ICD-10-CM | POA: Diagnosis not present

## 2020-05-16 ENCOUNTER — Encounter: Payer: Medicare Other | Admitting: Physical Therapy

## 2020-05-18 DIAGNOSIS — M25531 Pain in right wrist: Secondary | ICD-10-CM | POA: Diagnosis not present

## 2020-10-06 ENCOUNTER — Ambulatory Visit: Payer: Medicare Other | Admitting: Urology

## 2020-10-14 ENCOUNTER — Ambulatory Visit (INDEPENDENT_AMBULATORY_CARE_PROVIDER_SITE_OTHER): Payer: Medicare HMO | Admitting: Urology

## 2020-10-14 ENCOUNTER — Encounter: Payer: Self-pay | Admitting: Urology

## 2020-10-14 ENCOUNTER — Other Ambulatory Visit: Payer: Self-pay

## 2020-10-14 VITALS — BP 132/75 | HR 106 | Ht 71.0 in | Wt 162.0 lb

## 2020-10-14 DIAGNOSIS — R972 Elevated prostate specific antigen [PSA]: Secondary | ICD-10-CM

## 2020-10-14 DIAGNOSIS — N5201 Erectile dysfunction due to arterial insufficiency: Secondary | ICD-10-CM

## 2020-10-14 DIAGNOSIS — N400 Enlarged prostate without lower urinary tract symptoms: Secondary | ICD-10-CM

## 2020-10-14 DIAGNOSIS — E291 Testicular hypofunction: Secondary | ICD-10-CM | POA: Diagnosis not present

## 2020-10-14 NOTE — Progress Notes (Signed)
   10/14/2020 12:33 PM   [name] 1955/07/11 [id number]  Referring provider: Kirk Ruths, MD [facility] [place],  [place] [address]  Chief Complaint  Patient presents with  . Other    HPI: 65 y.o. male presents for semiannual follow-up.   Refer to Dr. Lyndal Rainbow note 04/08/2020 for a clinical summary  Remains on compounded testosterone with good energy level and libido  No bothersome LUTS  Remains on daily tadalafil   PMH: Past Medical History:  Diagnosis Date  . Cellulitis     Surgical History: History reviewed. No pertinent surgical history.  Home Medications:  Allergies as of 10/14/2020   No Known Allergies     Medication List       Accurate as of Oct 14, 2020 12:33 PM. If you have any questions, ask your nurse or doctor.        atorvastatin 10 MG tablet Commonly known as: LIPITOR Take 10 mg by mouth at bedtime.   Calcium Carbonate-Vitamin D 600-400 MG-UNIT tablet Take by mouth.   Cholecalciferol 25 MCG (1000 UT) capsule Take by mouth.   clonazePAM 1 MG tablet Commonly known as: KLONOPIN Take 1 mg by mouth 2 (two) times daily.   magnesium gluconate 500 MG tablet Commonly known as: MAGONATE Take by mouth.   potassium chloride 10 MEQ tablet Commonly known as: KLOR-CON TAKE 1 TABLET BY MOUTH 3 TIMES A DAY WITH FOOD   RA Fish Oil 1000 MG Caps Take by mouth.   risperiDONE 0.5 MG tablet Commonly known as: RISPERDAL Take by mouth.   tadalafil 5 MG tablet Commonly known as: CIALIS Take by mouth.       Allergies: No Known Allergies  Family History: History reviewed. No pertinent family history.  Social History:  reports that he has never smoked. He has never used smokeless tobacco. He reports that he does not drink alcohol and does not use drugs.   Physical Exam: BP 132/75   Pulse (!) 106   Ht 5\' 11"  (1.803 m)   Wt 162 lb (73.5 kg)   BMI 22.59 kg/m   Constitutional:  Alert and  oriented, No acute distress. HEENT: [place] AT, moist mucus membranes.  Trachea midline, no masses. Cardiovascular: No clubbing, cyanosis, or edema. Respiratory: Normal respiratory effort, no increased work of breathing. GU: Phallus without lesions, testes descended bilaterally without masses or tenderness left testis atrophic with estimated volume approximately 5 cc right testis atrophic with volume estimated at 8 cc.  Prostate 50 g, smooth without nodules Skin: No rashes, bruises or suspicious lesions. Neurologic: Grossly intact, no focal deficits, moving all 4 extremities. Psychiatric: Normal mood and affect.   Assessment & Plan:    1.  Hypogonadism  Stable on compounded topical testosterone  Testosterone profile 1, hematocrit and PSA drawn today  Lab visit 6 months for above labs and office visit 1 year for above labs and DRE  2.  Elevated PSA  PSA drawn as above  3.  BPH  Doing well status post Rezum  4.  Erectile dysfunction  Continue tadalafil 5 mg daily   Abbie Sons, MD  [facility] [address], [place], [place] [address] [phone]

## 2020-10-16 ENCOUNTER — Encounter: Payer: Self-pay | Admitting: Urology

## 2020-10-16 LAB — PSA: Prostate Specific Ag, Serum: 4.7 ng/mL — ABNORMAL HIGH (ref 0.0–4.0)

## 2020-10-16 LAB — HEMATOCRIT: Hematocrit: 48.2 % (ref 37.5–51.0)

## 2020-10-16 LAB — TESTOSTERONE,FREE AND TOTAL
Testosterone, Free: 30.4 pg/mL — ABNORMAL HIGH (ref 6.6–18.1)
Testosterone: >1500 ng/dL — ABNORMAL HIGH (ref 264–916)

## 2020-10-17 ENCOUNTER — Encounter: Payer: Self-pay | Admitting: Urology

## 2020-10-26 ENCOUNTER — Telehealth: Payer: Self-pay | Admitting: Urology

## 2020-10-26 NOTE — Telephone Encounter (Signed)
Talk with patient he wanted a sooner lab appt. Look on schedule there is not a early morning lab appt. . Talked with Dr. Bernardo Heater and advised him that patient called.

## 2020-10-26 NOTE — Telephone Encounter (Signed)
Patient called the office today to report that since his decrease in testosterone dosage, he has been feeling weak, tired and down.  He is requesting that we check his labs sooner.  Please advise.

## 2020-10-31 ENCOUNTER — Other Ambulatory Visit: Payer: Self-pay | Admitting: *Deleted

## 2020-10-31 DIAGNOSIS — R7989 Other specified abnormal findings of blood chemistry: Secondary | ICD-10-CM

## 2020-11-01 ENCOUNTER — Telehealth: Payer: Self-pay | Admitting: Urology

## 2020-11-01 DIAGNOSIS — E291 Testicular hypofunction: Secondary | ICD-10-CM

## 2020-11-01 NOTE — Telephone Encounter (Signed)
Pt called asking to make sure he gets Bio Available on his labs this coming up Friday. He didn't get these on his last draw. Please advise.

## 2020-11-01 NOTE — Telephone Encounter (Signed)
It has been ordered.

## 2020-11-04 ENCOUNTER — Other Ambulatory Visit: Payer: Self-pay

## 2020-11-04 ENCOUNTER — Other Ambulatory Visit: Payer: Medicare HMO

## 2020-11-04 DIAGNOSIS — R7989 Other specified abnormal findings of blood chemistry: Secondary | ICD-10-CM

## 2020-11-04 DIAGNOSIS — R972 Elevated prostate specific antigen [PSA]: Secondary | ICD-10-CM

## 2020-11-04 DIAGNOSIS — E291 Testicular hypofunction: Secondary | ICD-10-CM

## 2020-11-06 ENCOUNTER — Encounter: Payer: Self-pay | Admitting: Urology

## 2020-11-08 LAB — TESTOSTERONE FREE, PROFILE I
Albumin: 4.5 g/dL (ref 3.8–4.8)
Sex Hormone Binding: 39.2 nmol/L (ref 19.3–76.4)
Testost., Free, Calc: 56.6 pg/mL (ref 34.7–150.3)
Testosterone: 324 ng/dL (ref 264–916)

## 2020-11-08 LAB — TESTOSTERONE, BIOAVAILABLE (M)
Testost., % Free&Weakly Bound: 14.7 % (ref 9.0–46.0)
Testost., F&W Bound: 47.6 ng/dL (ref 40.0–250.0)

## 2020-11-17 ENCOUNTER — Other Ambulatory Visit: Payer: PRIVATE HEALTH INSURANCE

## 2021-02-23 ENCOUNTER — Telehealth: Payer: Self-pay

## 2021-02-23 ENCOUNTER — Other Ambulatory Visit: Payer: Self-pay | Admitting: Family Medicine

## 2021-02-23 MED ORDER — AMBULATORY NON FORMULARY MEDICATION: 0 refills | Status: DC

## 2021-02-23 NOTE — Telephone Encounter (Signed)
Spoke to patient and informed him that I received the fax for the first time today from [facility]. Because we have never filled this medication I called [facility] to ensure that this medication was wrote correctly and it has been faxed. Patient voiced understanding and will pick up RX.

## 2021-02-23 NOTE — Telephone Encounter (Signed)
Incoming message from pt on triage line, pt states that he has been requesting a refill on his compounded Testosterone since Monday of this week. He states that [facility] has not heard back from our office. 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 He states he currently has 4-5 days of medication left. Please advise on refill.

## 2021-04-10 ENCOUNTER — Other Ambulatory Visit: Payer: Self-pay

## 2021-04-10 DIAGNOSIS — R7989 Other specified abnormal findings of blood chemistry: Secondary | ICD-10-CM

## 2021-04-10 DIAGNOSIS — E291 Testicular hypofunction: Secondary | ICD-10-CM

## 2021-04-17 ENCOUNTER — Other Ambulatory Visit: Payer: PRIVATE HEALTH INSURANCE

## 2021-05-17 ENCOUNTER — Other Ambulatory Visit: Payer: Self-pay | Admitting: Physical Medicine & Rehabilitation

## 2021-05-17 DIAGNOSIS — G8929 Other chronic pain: Secondary | ICD-10-CM

## 2021-05-27 ENCOUNTER — Other Ambulatory Visit: Payer: Self-pay

## 2021-05-27 ENCOUNTER — Ambulatory Visit
Admission: RE | Admit: 2021-05-27 | Discharge: 2021-05-27 | Disposition: A | Discharge status: Home / Self Care | Payer: Medicare HMO | Source: Ambulatory Visit | Attending: Physical Medicine & Rehabilitation | Admitting: Physical Medicine & Rehabilitation

## 2021-05-27 DIAGNOSIS — G8929 Other chronic pain: Secondary | ICD-10-CM | POA: Diagnosis present

## 2021-05-27 DIAGNOSIS — M5441 Lumbago with sciatica, right side: Secondary | ICD-10-CM | POA: Insufficient documentation

## 2021-06-15 ENCOUNTER — Other Ambulatory Visit: Payer: Self-pay | Admitting: Urology

## 2021-06-15 NOTE — Telephone Encounter (Signed)
Pt Joshua Glover should be electronically sending refill for Testosterone

## 2021-06-19 ENCOUNTER — Telehealth: Payer: Self-pay | Admitting: Urology

## 2021-06-19 NOTE — Telephone Encounter (Signed)
Joshua Glover had an increase in his PSA in November at his PCP's office.  He needs an appointment with Dr. Bernardo Heater prior to refill of testosterone.

## 2021-06-19 NOTE — Telephone Encounter (Signed)
Spoke to patient and he states he received the Testosterone gel from [facility]. I informed him that his PCP did a PSA lab and it has increased. I informed him that per Larene [place] he will need to be seen for the elevated PSA. Patient states he has an appointment in May and he will just keep that appointment and talk to [facility] at that time.

## 2021-07-03 ENCOUNTER — Telehealth: Payer: Self-pay | Admitting: Family Medicine

## 2021-07-03 NOTE — Telephone Encounter (Signed)
Patient notified Testosterone compounding has not been approved by [employer].

## 2021-07-05 ENCOUNTER — Telehealth: Payer: Self-pay | Admitting: Family Medicine

## 2021-07-05 NOTE — Telephone Encounter (Signed)
Patient called stating he talked to [place] from [place] about the prior authorization tat was denied for his compounded medication and if we tried to do another PA they would look into it. However when I called [employer] about the PA [employer] does not cover any compounded medications. Patient states he filled out a form and sent it along with his receipt to the insurance company by mail. We are going to wait and see if he gets a response on the paperwork he submitted.

## 2021-10-03 ENCOUNTER — Other Ambulatory Visit: Payer: Self-pay | Admitting: Family Medicine

## 2021-10-04 ENCOUNTER — Other Ambulatory Visit: Payer: Self-pay | Admitting: Family Medicine

## 2021-10-04 MED ORDER — AMBULATORY NON FORMULARY MEDICATION: 1 refills | Status: DC

## 2021-10-16 ENCOUNTER — Other Ambulatory Visit: Payer: Medicare HMO

## 2021-10-16 DIAGNOSIS — R7989 Other specified abnormal findings of blood chemistry: Secondary | ICD-10-CM

## 2021-10-16 DIAGNOSIS — E291 Testicular hypofunction: Secondary | ICD-10-CM

## 2021-10-19 LAB — TESTOSTERONE, BIOAVAILABLE (M)
Testost., % Free&Weakly Bound: 19.2 % (ref 9.0–46.0)
Testost., F&W Bound: 140.9 ng/dL (ref 40.0–250.0)
Testosterone: 734 ng/dL (ref 264–916)

## 2021-10-19 LAB — PSA: Prostate Specific Ag, Serum: 6.4 ng/mL — ABNORMAL HIGH (ref 0.0–4.0)

## 2021-10-19 LAB — HEMATOCRIT: Hematocrit: 46.1 % (ref 37.5–51.0)

## 2021-10-20 ENCOUNTER — Ambulatory Visit: Payer: Medicare HMO | Admitting: Urology

## 2021-10-20 ENCOUNTER — Encounter: Payer: Self-pay | Admitting: Urology

## 2021-10-20 VITALS — BP 146/77 | HR 85 | Ht 71.0 in | Wt 171.0 lb

## 2021-10-20 DIAGNOSIS — R972 Elevated prostate specific antigen [PSA]: Secondary | ICD-10-CM | POA: Diagnosis not present

## 2021-10-20 DIAGNOSIS — E291 Testicular hypofunction: Secondary | ICD-10-CM

## 2021-10-20 DIAGNOSIS — N400 Enlarged prostate without lower urinary tract symptoms: Secondary | ICD-10-CM | POA: Diagnosis not present

## 2021-10-22 ENCOUNTER — Encounter: Payer: Self-pay | Admitting: Urology

## 2021-10-22 NOTE — Progress Notes (Signed)
10/20/2021 9:54 AM   Joshua Glover 02/07/1956 [id number]  Referring provider: Kirk Ruths, MD [facility] [place],  [place] [address]  Chief Complaint  Patient presents with   Elevated PSA    1 year follow up    HPI: 66 y.o. male presents for semiannual follow-up.  Refer to Dr. Lyndal Rainbow note 04/08/2020 for a clinical summary Remains on compounded testosterone with good energy level and libido No bothersome LUTS Remains on daily tadalafil Labs 10/16/2021: Total testosterone 734 ng/dL; bioavailable testosterone 140.9; HCT 46.1; PSA 6.4 States PSA elevated in 2011 after a bout of prostatitis and urinary retention.  He brought in a biopsy report from June 2019 which was a 12 core negative biopsy for a PSA of 5.3.  He had been on finasteride at the time the biopsy was performed.  The finasteride was subsequently discontinued. A prostate MRI August 2020 showed a prostate volume of 62 g and no radiographic evidence of high-grade prostate cancer He underwent Rezum tx by Dr. Junious Silk January 2021 with improvement in his lower urinary tract symptoms and lowering of his PSA   PMH: Past Medical History:  Diagnosis Date   Cellulitis     Surgical History: No past surgical history on file.  Home Medications:  Allergies as of 10/20/2021   No Known Allergies      Medication List        Accurate as of Oct 20, 2021 11:59 PM. If you have any questions, ask your nurse or doctor.          STOP taking these medications    Testosterone Powd Stopped by: Abbie Sons, MD       TAKE these medications    AMBULATORY NON FORMULARY MEDICATION Testosterone (AT) (Topi) 15% ('150mg'$ /ML) Gel   atorvastatin 10 MG tablet Commonly known as: LIPITOR Take 10 mg by mouth at bedtime.   Calcium Carbonate-Vitamin D 600-400 MG-UNIT tablet Take by mouth.   Cholecalciferol 25 MCG (1000 UT) capsule Take by mouth.   clonazePAM 1 MG  tablet Commonly known as: KLONOPIN Take 1 mg by mouth 2 (two) times daily.   magnesium gluconate 500 MG tablet Commonly known as: MAGONATE Take by mouth.   potassium chloride 10 MEQ tablet Commonly known as: KLOR-CON TAKE 1 TABLET BY MOUTH 3 TIMES A DAY WITH FOOD   RA Fish Oil 1000 MG Caps Take by mouth.   risperiDONE 0.5 MG tablet Commonly known as: RISPERDAL Take by mouth.   tadalafil 5 MG tablet Commonly known as: CIALIS Take by mouth.        Allergies: No Known Allergies  Family History: No family history on file.  Social History:  reports that he has never smoked. He has never used smokeless tobacco. He reports that he does not drink alcohol and does not use drugs.   Physical Exam: BP (!) 146/77   Pulse 85   Ht '5\' 11"'$  (1.803 m)   Wt 171 lb (77.6 kg)   BMI 23.85 kg/m   Constitutional:  Alert and oriented, No acute distress. HEENT: [place] AT, moist mucus membranes.  Trachea midline, no masses. Cardiovascular: No clubbing, cyanosis, or edema. Respiratory: Normal respiratory effort, no increased work of breathing. GU: Prostate 50 g, smooth without nodules Skin: No rashes, bruises or suspicious lesions. Neurologic: Grossly intact, no focal deficits, moving all 4 extremities. Psychiatric: Normal mood and affect.   Assessment & Plan:    1.  Hypogonadism Stable on compounded  topical testosterone Schedule lab visit 6 months total/bioavailable testosterone, hematocrit, PSA Office visit 1 year  2.  Elevated PSA Current PSA level lower than his uncorrected PSA at the time of biopsy in 2019 Repeat PSA as above  3.  BPH Doing well status post Rezum    Abbie Sons, MD  [facility] [address], [place], [place] [address] [phone]

## 2021-11-03 ENCOUNTER — Other Ambulatory Visit: Payer: Self-pay

## 2022-01-01 ENCOUNTER — Other Ambulatory Visit: Payer: Self-pay | Admitting: *Deleted

## 2022-01-01 ENCOUNTER — Telehealth: Payer: Self-pay | Admitting: Urology

## 2022-01-01 MED ORDER — TADALAFIL 5 MG PO TABS: 5.0000 mg | ORAL_TABLET | Freq: Every day | ORAL | 2 refills | Status: DC

## 2022-01-01 NOTE — Telephone Encounter (Signed)
Pt currently sees Dr Bernardo Heater.  Pt used to see [place] @ [facility] who prescribed Cialis (Sildenafil) 5 mg 1 yr supply and would like a new RX sent to [facility] in [place].  He wants to use [facility] because he insurance will not cover this for ED.

## 2022-01-02 ENCOUNTER — Other Ambulatory Visit: Payer: Self-pay | Admitting: Urology

## 2022-01-02 ENCOUNTER — Encounter: Payer: Self-pay | Admitting: Urology

## 2022-01-02 MED ORDER — TADALAFIL 5 MG PO TABS: 5.0000 mg | ORAL_TABLET | Freq: Every day | ORAL | 0 refills | Status: DC

## 2022-04-23 ENCOUNTER — Other Ambulatory Visit: Payer: Medicare HMO

## 2022-04-23 DIAGNOSIS — E291 Testicular hypofunction: Secondary | ICD-10-CM

## 2022-04-23 DIAGNOSIS — R972 Elevated prostate specific antigen [PSA]: Secondary | ICD-10-CM

## 2022-04-26 ENCOUNTER — Encounter: Payer: Self-pay | Admitting: Urology

## 2022-04-30 ENCOUNTER — Encounter: Payer: Self-pay | Admitting: *Deleted

## 2022-04-30 LAB — TESTOSTERONE, BIOAVAILABLE (M)
Testost., % Free&Weakly Bound: 26.4 % (ref 9.0–46.0)
Testost., F&W Bound: 279.8 ng/dL — ABNORMAL HIGH (ref 40.0–250.0)
Testosterone: 1060 ng/dL — ABNORMAL HIGH (ref 264–916)

## 2022-04-30 LAB — PSA: Prostate Specific Ag, Serum: 5 ng/mL — ABNORMAL HIGH (ref 0.0–4.0)

## 2022-04-30 LAB — HEMATOCRIT: Hematocrit: 49.3 % (ref 37.5–51.0)

## 2022-06-07 ENCOUNTER — Encounter: Payer: Self-pay | Admitting: Urology

## 2022-06-08 ENCOUNTER — Other Ambulatory Visit: Payer: Self-pay | Admitting: Family Medicine

## 2022-06-08 MED ORDER — AMBULATORY NON FORMULARY MEDICATION: 1 refills | Status: DC

## 2022-10-02 ENCOUNTER — Other Ambulatory Visit: Payer: Self-pay

## 2022-10-02 DIAGNOSIS — Z136 Encounter for screening for cardiovascular disorders: Secondary | ICD-10-CM

## 2022-10-19 ENCOUNTER — Other Ambulatory Visit: Payer: Medicare HMO

## 2022-10-23 ENCOUNTER — Other Ambulatory Visit: Payer: Self-pay

## 2022-10-23 ENCOUNTER — Other Ambulatory Visit: Payer: Medicare HMO

## 2022-10-23 ENCOUNTER — Ambulatory Visit
Admission: RE | Admit: 2022-10-23 | Discharge: 2022-10-23 | Disposition: A | Discharge status: Home / Self Care | Payer: Medicare HMO | Source: Ambulatory Visit | Attending: Internal Medicine | Admitting: Internal Medicine

## 2022-10-23 DIAGNOSIS — R972 Elevated prostate specific antigen [PSA]: Secondary | ICD-10-CM

## 2022-10-23 DIAGNOSIS — N400 Enlarged prostate without lower urinary tract symptoms: Secondary | ICD-10-CM

## 2022-10-23 DIAGNOSIS — E291 Testicular hypofunction: Secondary | ICD-10-CM

## 2022-10-23 DIAGNOSIS — Z136 Encounter for screening for cardiovascular disorders: Secondary | ICD-10-CM | POA: Insufficient documentation

## 2022-10-24 ENCOUNTER — Ambulatory Visit: Payer: Medicare HMO | Admitting: Urology

## 2022-10-24 LAB — TESTOSTERONE: Testosterone: 1168 ng/dL — ABNORMAL HIGH (ref 264–916)

## 2022-10-24 LAB — PSA: Prostate Specific Ag, Serum: 5.3 ng/mL — ABNORMAL HIGH (ref 0.0–4.0)

## 2022-10-24 LAB — HEMATOCRIT: Hematocrit: 48.9 % (ref 37.5–51.0)

## 2022-10-31 ENCOUNTER — Encounter: Payer: Self-pay | Admitting: Urology

## 2022-10-31 ENCOUNTER — Ambulatory Visit: Payer: Medicare HMO | Admitting: Urology

## 2022-10-31 VITALS — BP 152/72 | HR 82 | Ht 70.0 in | Wt 173.0 lb

## 2022-10-31 DIAGNOSIS — E291 Testicular hypofunction: Secondary | ICD-10-CM

## 2022-10-31 DIAGNOSIS — N400 Enlarged prostate without lower urinary tract symptoms: Secondary | ICD-10-CM

## 2022-10-31 DIAGNOSIS — R972 Elevated prostate specific antigen [PSA]: Secondary | ICD-10-CM | POA: Diagnosis not present

## 2022-10-31 NOTE — Progress Notes (Signed)
I, Duke Salvia, acting as a Neurosurgeon for Riki Altes, MD., have documented all relevant documentation on the behalf of Riki Altes, MD, as directed by  Riki Altes, MD while in the presence of Riki Altes, MD.   10/31/2022 2:10 PM   Joshua Glover [birth year] [id number]  Referring provider: Lauro Regulus, MD [address] [facility] [place],  [place] [address]  Chief Complaint  Patient presents with   Hypogonadism    HPI: 66 y.o. male presents for annual follow-up.  Refer to Dr. Estil Daft note 04/08/2020 for a clinical summary Remains on compounded testosterone with good energy level and libido No bothersome LUTS Remains on daily tadalafil Labs 10/23/2022: Total testosterone 1,168 ng/dL; bioavailable testosterone 140.9; HCT 48.9; PSA stable 5.3 States PSA elevated in 2011 after a bout of prostatitis and urinary retention.  He brought in a biopsy report from June 2019 which was a 12 core negative biopsy for a PSA of 5.3.  He had been on finasteride at the time the biopsy was performed.  The finasteride was subsequently discontinued. A prostate MRI August 2020 showed a prostate volume of 62 g and no radiographic evidence of high-grade prostate cancer He underwent Rezum tx by Dr. Mena Goes January 2021 with improvement in his lower urinary tract symptoms and lowering of his PSA   PMH: Past Medical History:  Diagnosis Date   Cellulitis     Surgical History: History reviewed. No pertinent surgical history.  Home Medications:  Allergies as of 10/31/2022   No Known Allergies      Medication List        Accurate as of October 31, 2022  2:10 PM. If you have any questions, ask your nurse or doctor.          STOP taking these medications    Calcium Carbonate-Vitamin D 600-400 MG-UNIT tablet Stopped by: Riki Altes, MD   Cholecalciferol 25 MCG (1000 UT) capsule Stopped by: Riki Altes, MD   clonazePAM 1 MG tablet Commonly  known as: KLONOPIN Stopped by: Riki Altes, MD   magnesium gluconate 500 MG tablet Commonly known as: MAGONATE Stopped by: Riki Altes, MD   RA Fish Oil 1000 MG Caps Stopped by: Riki Altes, MD   risperiDONE 0.5 MG tablet Commonly known as: RISPERDAL Stopped by: Riki Altes, MD   tadalafil 5 MG tablet Commonly known as: CIALIS Stopped by: Riki Altes, MD       TAKE these medications    AMBULATORY NON FORMULARY MEDICATION Testosterone (AT) (Topi) 15% (150mg /ML) Gel   atorvastatin 10 MG tablet Commonly known as: LIPITOR Take 10 mg by mouth at bedtime.   celecoxib 200 MG capsule Commonly known as: CELEBREX Take 200 mg by mouth daily. What changed: Another medication with the same name was removed. Continue taking this medication, and follow the directions you see here. Changed by: Riki Altes, MD   gabapentin 300 MG capsule Commonly known as: NEURONTIN Take 300 mg by mouth 3 (three) times daily.   potassium chloride 10 MEQ tablet Commonly known as: KLOR-CON Take 10 mEq by mouth daily. What changed: Another medication with the same name was removed. Continue taking this medication, and follow the directions you see here. Changed by: Riki Altes, MD   QC Calcium/Minerals/Vitamin D 600-400 MG-UNIT Tabs 1 tablet with a meal Orally Once a day        Family History: History reviewed. No  pertinent family history.  Social History:  reports that he has never smoked. He has never used smokeless tobacco. He reports that he does not drink alcohol and does not use drugs.   Physical Exam: BP (!) 152/72   Pulse 82   Ht 5\' 10"  (1.778 m)   Wt 173 lb (78.5 kg)   BMI 24.82 kg/m   Constitutional:  Alert and oriented, No acute distress. HEENT: [name] AT, moist mucus membranes.  Trachea midline, no masses. Cardiovascular: No clubbing, cyanosis, or edema. Respiratory: Normal respiratory effort, no increased work of breathing. Skin: No rashes,  bruises or suspicious lesions. Neurologic: Grossly intact, no focal deficits, moving all 4 extremities. Psychiatric: Normal mood and affect.   Assessment & Plan:    1.  Hypogonadism Slightly increased testosterone level, but will not make any changes at this time and continue to monitor. Lab visit 6 months with a testosterone panel, which he prefers (total, bioavailable, and free testosterone), PSA. Office visit in one year with labs.  2. Elevated PSA Previous biopsy for a PSA of 5.3 and MRI in 2020 showed no abnormality suspicious for high grade prostate cancer. Should his PSA rise 6 or greater, will obtain prostate MRI. DRE next visit.  3. BPH No bothersome LUTS, status post Rezum.      [facility] [address] [place], [place] [address] [phone]

## 2022-11-30 ENCOUNTER — Other Ambulatory Visit: Payer: Self-pay | Admitting: Sports Medicine

## 2022-11-30 DIAGNOSIS — G8929 Other chronic pain: Secondary | ICD-10-CM

## 2022-11-30 DIAGNOSIS — M7541 Impingement syndrome of right shoulder: Secondary | ICD-10-CM

## 2022-11-30 DIAGNOSIS — M7551 Bursitis of right shoulder: Secondary | ICD-10-CM

## 2022-11-30 DIAGNOSIS — M778 Other enthesopathies, not elsewhere classified: Secondary | ICD-10-CM

## 2022-12-05 ENCOUNTER — Ambulatory Visit
Admission: RE | Admit: 2022-12-05 | Discharge: 2022-12-05 | Disposition: A | Discharge status: Home / Self Care | Payer: Medicare HMO | Source: Ambulatory Visit | Attending: Sports Medicine | Admitting: Sports Medicine

## 2022-12-05 DIAGNOSIS — M7541 Impingement syndrome of right shoulder: Secondary | ICD-10-CM

## 2022-12-05 DIAGNOSIS — M778 Other enthesopathies, not elsewhere classified: Secondary | ICD-10-CM

## 2022-12-05 DIAGNOSIS — G8929 Other chronic pain: Secondary | ICD-10-CM

## 2022-12-05 DIAGNOSIS — M7551 Bursitis of right shoulder: Secondary | ICD-10-CM

## 2022-12-13 ENCOUNTER — Other Ambulatory Visit: Payer: Medicare HMO

## 2023-02-11 ENCOUNTER — Telehealth: Payer: Self-pay | Admitting: Urology

## 2023-02-11 ENCOUNTER — Other Ambulatory Visit: Payer: Self-pay | Admitting: *Deleted

## 2023-02-11 MED ORDER — AMBULATORY NON FORMULARY MEDICATION: 1 refills | Status: DC

## 2023-02-11 NOTE — Telephone Encounter (Signed)
Patient called [facility] requesting refill for Testosterone gel. He is also requesting that a refill be added to this when sent to pharmacy. He said he has spoken to Dr. Lonna Cobb about this previously. Pharmacy is [facility] in [place].

## 2023-02-11 NOTE — Telephone Encounter (Signed)
Printed and faxed

## 2023-03-18 ENCOUNTER — Ambulatory Visit: Payer: Medicare HMO | Attending: Sports Medicine

## 2023-03-18 DIAGNOSIS — M25611 Stiffness of right shoulder, not elsewhere classified: Secondary | ICD-10-CM | POA: Diagnosis present

## 2023-03-18 DIAGNOSIS — M25511 Pain in right shoulder: Secondary | ICD-10-CM | POA: Diagnosis present

## 2023-03-18 NOTE — Therapy (Signed)
OUTPATIENT PHYSICAL THERAPY SHOULDER EVALUATION   Patient Name: Joshua Glover MRN: [id number] DOB:1955-06-02, 67 y.o., male Today's Date: 03/18/2023  END OF SESSION:  PT End of Session - 03/18/23 1009     Visit Number 1    Number of Visits 12    Date for PT Re-Evaluation 04/29/23    PT Start Time 1015    PT Stop Time 1059    PT Time Calculation (min) 44 min    Activity Tolerance Patient tolerated treatment well    Behavior During Therapy [facility] for tasks assessed/performed             Past Medical History:  Diagnosis Date   Cellulitis    History reviewed. No pertinent surgical history. Patient Active Problem List   Diagnosis Date Noted   Anxiety, generalized 02/02/2020   Family history of abdominal aortic aneurysm 02/02/2020   Healthcare maintenance 02/02/2020   Testosterone deficiency 02/02/2020    PCP: Einar Crow, MD  REFERRING PROVIDER: Dorthula Nettles, DO  REFERRING DIAG:  Diagnosis  M25.511 (ICD-10-CM) - Pain in right shoulder  M75.41 (ICD-10-CM) - Impingement syndrome of right shoulder    THERAPY DIAG:  Right shoulder pain, unspecified chronicity  Rationale for Evaluation and Treatment: Rehabilitation  ONSET DATE: April 2024  SUBJECTIVE:                                                                                                                                                                                      SUBJECTIVE STATEMENT: Patient is a 67 year old male presenting to PT evaluation for right shoulder pain. Patient expressed he played sports all his life including table tennis since the age of 46. He states on average he plays three to four hours at a time multiple times a week. He reports he was playing table tennis and experienced some shoulder tenderness post game. He reports he "put himself" on high dose of medication which did not help and he followed up with his PCP. His PCP performed a MRI which showed tearing in the rotator cuff  tendons. Pt then received two steroid injections fifteen days apart one in bursa one in joint which he reported had little to no effect. Pt trialed therapy at [facility] where he did five or six sessions and was not noticing any improvements in strengthening or range of motion. He reports he tried three treatments of infrared therapy with little to no effects and was discharged because he was not progressing. His  PCP then provided a referral for a surgeon. He is back to trial physical therapy and dry needling to determine if he needs a more invasive approach.  In terms  of function, he notices his left arm can reach further behind him than his right and he has had to make adjustments to ADL's due to limited range of motion including putting a shirt on and administering topical agents to his shoulder.     Hand dominance: Right  PERTINENT HISTORY:  Patient has a history of Anxiety, Arthritis BPH (benign prostatic hyperplasia), Folliculitis, Hypercholesterolemia, Melanoma (CMS/HHS-HCC). He has tried physical therapy, rest, and ice with no substantial benefit. Referral notes state, "patient would like to work with someone experienced in dry needling." Patient expressed he played sports all his life including table tennis since the age of 60. He states on average he plays three to four hours at a time multiple times a week. He reports he was playing table tennis and experienced some shoulder tenderness post game. He reports he "put himself" on high dose of medication which did not help and he followed up with his PCP. His PCP performed a MRI which showed tearing in the rotator cuff tendons. Pt then received two steroid injections fifteen days apart one in bursa one in joint which he reported had little to no effect. Pt trialed therapy at [facility] where he did five or six sessions and was not noticing any improvements in strengthening or range of motion. He reports he tried three treatments of infrared therapy with  little to no effects and was discharged because he was not progressing. His  PCP then provided a referral for a surgeon. He is back to trial physical therapy and dry needling to determine if he needs a more invasive approach.  In terms of function, he notices his left arm can reach further behind him than his right and he has had to make adjustments to ADL's due to limited range of motion including putting a shirt on and administering topical agents to his shoulder.   PAIN:  Are you having pain? Yes: NPRS scale: 1-2/10 Pain location: right shoulder  Pain description: dull, achy, does not radiate, very localized Aggravating factors: reaching overhead Relieving factors: has been sleeping on his left shoulder to reduce pain   PRECAUTIONS: None  RED FLAGS: None   WEIGHT BEARING RESTRICTIONS: No  FALLS:  Has patient fallen in last 6 months? No  LIVING ENVIRONMENT: Lives with:  spouse   OCCUPATION: Clinical Hospital Pharmacist -Retired  PLOF: Independent  PATIENT GOALS: reduce pain and discomfort, for right shoulder to feel similar to left shoulder   NEXT MD VISIT: N/A  OBJECTIVE:  Note: Objective measures were completed at Evaluation unless otherwise noted.  DIAGNOSTIC FINDINGS:  Shoulder X-Ray Imaging: These films demonstrate no evidence for fractures, lytic lesions, or significant degenerative changes. The subacromial space is mildly decreased. There is no subacromial or infra-clavicular spurring. He demonstrates a Type II acromion.  Shoulder Imaging, MRI: Right Shoulder: MRI Shoulder Cartilage: No cartilage abnormality. MRI Shoulder Rotator Cuff: Moderate tendinopathic changes of the supraspinatus more so than the infraspinatus tendons with mild partial-thickness bursal and articular sided tearing. No retraction. MRI Shoulder Labrum / Biceps: Biceps tendinopathy. MRI Shoulder Bone: Normal bone.   PATIENT SURVEYS:  FOTO 27  COGNITION: Overall cognitive status: Within  functional limits for tasks assessed     SENSATION: Not tested    UPPER EXTREMITY ROM:   Active ROM Right eval Left eval  Shoulder flexion 134 151  Shoulder extension 74 80  Shoulder abduction 141 152  (Blank rows = not tested)  UPPER EXTREMITY MMT:  MMT Right eval Left eval  Shoulder  flexion 4/5 5/5  Shoulder extension 4+/5 5/5  Shoulder abduction 4/5 4+/5  Shoulder adduction    Shoulder internal rotation 4/5 4+/5  Shoulder external rotation 4/5 4+/5  Middle trapezius    Lower trapezius    Elbow flexion 4+/5 5/5  Elbow extension 4/5 5/5  (Blank rows = not tested)    SHOULDER SPECIAL TESTS: Impingement tests: Hawkins/Kennedy impingement test: negative and Painful arc test: positive    Rotator cuff assessment: Empty can test: positive , Full can test: negative, Gerber lift off test: negative, and External Rotation Resistance: positive  Biceps assessment:  perform next session Acromioclavicular Palpation: Negative     JOINT MOBILITY TESTING:  Anterior to posterior shoulder mobilization- Some fluid, without pain, moves well  Posterior to Anterior: WFL; some fluid Inferior glide: WFL; some gentle resistance   PALPATION:  Tightness in right supraspinatus, right infraspinatus, right subscapularis, upper traps, and right biceps    TODAY'S TREATMENT:                                                                                                                                         DATE: 03/18/23 Eval   Trigger Point Dry Needling (TDN), unbilled Education performed with patient regarding potential benefit of TDN. Reviewed precautions and risks with patient. Reviewed special precautions/risks over lung fields which include pneumothorax. Reviewed signs and symptoms of pneumothorax and advised pt to go to ER immediately if these symptoms develop advise them of dry needling treatment. Extensive time spent with pt to ensure full understanding of TDN risks. Pt provided  verbal consent to treatment. TDN performed to  with 0.25 x 40 single needle placements with local twitch response (LTR). Pistoning technique utilized. Improved pain-free motion following intervention.R upper trap      PATIENT EDUCATION: Education details: POC, dry needling Person educated: Patient Education method: Medical illustrator Education comprehension: verbalized understanding  HOME EXERCISE PROGRAM:  Will introduce next session   ASSESSMENT:  CLINICAL IMPRESSION: Patient is a 67 y.o. male who was seen today for physical therapy evaluation and treatment for right shoulder pain and weakness. He presents with weakness in his upper extremities with the right affected more than the left. He is limited in his functional ability evidenced by limited range of motion in the right shoulder and inability to perform motions without pain. He presents with tightness of the rotator cuff muscles and overcompensates for his lack of mobility through scapular elevation. Patient has undergone physical therapy without success at a different clinic, is primarily referred to this clinic for dry needling. He will benefit from skilled physical therapy to maximize independence and improve his quality of life.   OBJECTIVE IMPAIRMENTS: decreased activity tolerance, decreased ROM, decreased strength, impaired perceived functional ability, impaired flexibility, impaired UE functional use, improper body mechanics, postural dysfunction, and pain.   ACTIVITY LIMITATIONS: lifting, sleeping, bathing, toileting, dressing, reach over head, and hygiene/grooming  PARTICIPATION LIMITATIONS: cleaning, laundry, and yard work  PERSONAL FACTORS: Age, Past/current experiences, Profession, and 3+ comorbidities: anxiety, arthritis, melanoma, hypercholesterolemia,   are also affecting patient's functional outcome. REHAB POTENTIAL: Fair    CLINICAL DECISION MAKING: Stable/uncomplicated  EVALUATION COMPLEXITY:  Moderate   GOALS: Goals reviewed with patient? Yes  SHORT TERM GOALS: Target date: 04/08/23  Patient will be independent in home exercise program to improve strength/mobility for better functional independence with ADLs. Baseline: Will develop HEP next session  Goal status: INITIAL     LONG TERM GOALS: Target date: 04/29/23  Patient will improve right shoulder AROM to > 140 degrees of flexion for improved ability to perform overhead activities. Baseline: 134 degrees  Goal status: INITIAL  2.  Patient will increase FOTO score to equal to or greater than 80 to demonstrate statistically significant improvement in mobility and quality of life. Baseline: 70 Goal status: INITIAL  3.  Patient will increase RUE gross strength to 4+/5 as to improve functional strength and increased ADL ability. Baseline: In MMT table  Goal status: INITIAL  4.  Patient will donn/doff shirt without discomfort and/or compensation for return to PLOF. Baseline: modify shirt donning/doffing.  Goal status: INITIAL     PLAN:  PT FREQUENCY: 1-2x/week  PT DURATION: 6 weeks  PLANNED INTERVENTIONS: 97164- PT Re-evaluation, 97110-Therapeutic exercises, 97530- Therapeutic activity, 97112- Neuromuscular re-education, 97535- Self Care, 16109- Manual therapy, [phone]- Gait training, [phone]- Electrical stimulation (manual), Patient/Family education, Balance training, Stair training, Taping, Dry Needling, Joint mobilization, Cryotherapy, and Moist heat  PLAN FOR NEXT SESSION: further assessment (Biceps Load I and II, Bear Hug), Home Exercise Plan, dry needling (possibly of supra, infra, upper trap region), stretching, strengthening    Randon Goldsmith, Student-PT 03/18/2023, 3:15 PM   This entire session was performed under direct supervision and direction of a licensed therapist/therapist assistant . I have personally read, edited and approve of the note as written.  Precious Bard, PT, DPT Physical Therapist - [facility] [place]  Outpatient Physical Therapy- Main Campus [phone]

## 2023-03-19 ENCOUNTER — Ambulatory Visit: Payer: Medicare HMO

## 2023-03-20 ENCOUNTER — Ambulatory Visit: Payer: Medicare HMO

## 2023-03-20 DIAGNOSIS — M25611 Stiffness of right shoulder, not elsewhere classified: Secondary | ICD-10-CM

## 2023-03-20 DIAGNOSIS — M25511 Pain in right shoulder: Secondary | ICD-10-CM

## 2023-03-20 NOTE — Therapy (Signed)
OUTPATIENT PHYSICAL THERAPY SHOULDER TREATMENT   Patient Name: Joshua Glover MRN: [id number] DOB:1955/08/22, 67 y.o., male Today's Date: 03/21/2023  END OF SESSION:  PT End of Session - 03/20/23 1308     Visit Number 2    Number of Visits 12    Date for PT Re-Evaluation 04/29/23    PT Start Time 1314    PT Stop Time 1347    PT Time Calculation (min) 33 min    Activity Tolerance Patient tolerated treatment well    Behavior During Therapy WFL for tasks assessed/performed             Past Medical History:  Diagnosis Date   Cellulitis    No past surgical history on file. Patient Active Problem List   Diagnosis Date Noted   Anxiety, generalized 02/02/2020   Family history of abdominal aortic aneurysm 02/02/2020   Healthcare maintenance 02/02/2020   Testosterone deficiency 02/02/2020    PCP: Einar Crow, MD  REFERRING PROVIDER: Dorthula Nettles, DO  REFERRING DIAG:  Diagnosis  M25.511 (ICD-10-CM) - Pain in right shoulder  M75.41 (ICD-10-CM) - Impingement syndrome of right shoulder    THERAPY DIAG:  Right shoulder pain, unspecified chronicity  Stiffness of right shoulder, not elsewhere classified  Rationale for Evaluation and Treatment: Rehabilitation  ONSET DATE: April 2024  SUBJECTIVE:                                                                                                                                                                                      SUBJECTIVE STATEMENT: Today: Patient denies any current pain and states he was able to move his arm better after initial session. Reports only performing a few exercises from previous PT instruction at other clinic.   Subjective from original PT Eval:  Patient is a 67 year old male presenting to PT evaluation for right shoulder pain. Patient expressed he played sports all his life including table tennis since the age of 89. He states on average he plays three to four hours at a time multiple  times a week. He reports he was playing table tennis and experienced some shoulder tenderness post game. He reports he "put himself" on high dose of medication which did not help and he followed up with his PCP. His PCP performed a MRI which showed tearing in the rotator cuff tendons. Pt then received two steroid injections fifteen days apart one in bursa one in joint which he reported had little to no effect. Pt trialed therapy at [facility] where he did five or six sessions and was not noticing any improvements in strengthening or range of motion. He reports he tried three treatments  of infrared therapy with little to no effects and was discharged because he was not progressing. His  PCP then provided a referral for a surgeon. He is back to trial physical therapy and dry needling to determine if he needs a more invasive approach.  In terms of function, he notices his left arm can reach further behind him than his right and he has had to make adjustments to ADL's due to limited range of motion including putting a shirt on and administering topical agents to his shoulder.     Hand dominance: Right  PERTINENT HISTORY:  Patient has a history of Anxiety, Arthritis BPH (benign prostatic hyperplasia), Folliculitis, Hypercholesterolemia, Melanoma (CMS/HHS-HCC). He has tried physical therapy, rest, and ice with no substantial benefit. Referral notes state, "patient would like to work with someone experienced in dry needling." Patient expressed he played sports all his life including table tennis since the age of 65. He states on average he plays three to four hours at a time multiple times a week. He reports he was playing table tennis and experienced some shoulder tenderness post game. He reports he "put himself" on high dose of medication which did not help and he followed up with his PCP. His PCP performed a MRI which showed tearing in the rotator cuff tendons. Pt then received two steroid injections fifteen days  apart one in bursa one in joint which he reported had little to no effect. Pt trialed therapy at [facility] where he did five or six sessions and was not noticing any improvements in strengthening or range of motion. He reports he tried three treatments of infrared therapy with little to no effects and was discharged because he was not progressing. His  PCP then provided a referral for a surgeon. He is back to trial physical therapy and dry needling to determine if he needs a more invasive approach.  In terms of function, he notices his left arm can reach further behind him than his right and he has had to make adjustments to ADL's due to limited range of motion including putting a shirt on and administering topical agents to his shoulder.   PAIN:  Are you having pain? Yes: NPRS scale: 1-2/10 Pain location: right shoulder  Pain description: dull, achy, does not radiate, very localized Aggravating factors: reaching overhead Relieving factors: has been sleeping on his left shoulder to reduce pain   PRECAUTIONS: None  RED FLAGS: None   WEIGHT BEARING RESTRICTIONS: No  FALLS:  Has patient fallen in last 6 months? No  LIVING ENVIRONMENT: Lives with:  spouse   OCCUPATION: Clinical Hospital Pharmacist -Retired  PLOF: Independent  PATIENT GOALS: reduce pain and discomfort, for right shoulder to feel similar to left shoulder   NEXT MD VISIT: N/A  OBJECTIVE:  Note: Objective measures were completed at Evaluation unless otherwise noted.  DIAGNOSTIC FINDINGS:  Shoulder X-Ray Imaging: These films demonstrate no evidence for fractures, lytic lesions, or significant degenerative changes. The subacromial space is mildly decreased. There is no subacromial or infra-clavicular spurring. He demonstrates a Type II acromion.  Shoulder Imaging, MRI: Right Shoulder: MRI Shoulder Cartilage: No cartilage abnormality. MRI Shoulder Rotator Cuff: Moderate tendinopathic changes of the supraspinatus more so  than the infraspinatus tendons with mild partial-thickness bursal and articular sided tearing. No retraction. MRI Shoulder Labrum / Biceps: Biceps tendinopathy. MRI Shoulder Bone: Normal bone.   PATIENT SURVEYS:  FOTO 33  COGNITION: Overall cognitive status: Within functional limits for tasks assessed     SENSATION: Not  tested    UPPER EXTREMITY ROM:   Active ROM Right eval Left eval  Shoulder flexion 134 151  Shoulder extension 74 80  Shoulder abduction 141 152  (Blank rows = not tested)  UPPER EXTREMITY MMT:  MMT Right eval Left eval  Shoulder flexion 4/5 5/5  Shoulder extension 4+/5 5/5  Shoulder abduction 4/5 4+/5  Shoulder adduction    Shoulder internal rotation 4/5 4+/5  Shoulder external rotation 4/5 4+/5  Middle trapezius    Lower trapezius    Elbow flexion 4+/5 5/5  Elbow extension 4/5 5/5  (Blank rows = not tested)    SHOULDER SPECIAL TESTS: Impingement tests: Hawkins/Kennedy impingement test: negative and Painful arc test: positive    Rotator cuff assessment: Empty can test: positive , Full can test: negative, Gerber lift off test: negative, and External Rotation Resistance: positive  Biceps assessment:  perform next session Acromioclavicular Palpation: Negative     JOINT MOBILITY TESTING:  Anterior to posterior shoulder mobilization- Some fluid, without pain, moves well  Posterior to Anterior: WFL; some fluid Inferior glide: WFL; some gentle resistance   PALPATION:  Tightness in right supraspinatus, right infraspinatus, right subscapularis, upper traps, and right biceps    TODAY'S TREATMENT:                                                                                                                                         DATE: 03/20/2023   Review of shoulder anatomy specifically rotator cuff muscles - Location and action.  Review of dry needling - purpose, indications, and contraindications  TDN Treatment: Dustin Flock)  Patient  consent: After explanation of TDN Rationale, Procedures, outcomes, and potential side effects, patient verbalized consent to TDN treatment. Region/Dx: R shoulder immobility Muscles Treated: R UT, R infraspinatus, Middle deltoid region, Anterior deltoid region using 0.3 x 30 needles Post treatment pain/response: 1 local twitch response in R infraspinatus region and no adverse reaction to needling today.  Post treatment Instructions: Patient instructed to expect mild to moderate muscle soreness this evening and tomorrow. Patient instructed to continued prescribed home exercise program. Since dry needling over lung field, patient instructed of signs and symptoms of pneumothorax. Patient also educated on signs and symptoms of infection, however unlikely, and to seek immediate medical attention shoulder thy occur. Patient verbalized understanding of these instructions.  THEREX: - Instruction in standing Right RC strengthening (in pain free ROM)   Standing IR with GTB with towel roll 2 sets of 10 reps (VC for technique and reminders to keep elbow tucked in on towel roll).  Standing ER with GTB with towel roll 2 sets of 10 reps    PATIENT EDUCATION: Education details: POC, dry needling Person educated: Patient Education method: Medical illustrator Education comprehension: verbalized understanding  HOME EXERCISE PROGRAM:  Will introduce next session   ASSESSMENT:  CLINICAL IMPRESSION: Treatment focused on plan of care as outlined in  initial PT evaluation including dry needling for increased tightness of the rotator cuff muscles. He responded well without any adverse reaction and then was educated on some pain free rotator cuff muscle strengthening. He required VC and visual demonstration to perform correctly yet no issues or report of pain today. He will need review and progression of UE strengthening as appropriate.  He will benefit from skilled physical therapy to maximize independence  and improve his quality of life.   OBJECTIVE IMPAIRMENTS: decreased activity tolerance, decreased ROM, decreased strength, impaired perceived functional ability, impaired flexibility, impaired UE functional use, improper body mechanics, postural dysfunction, and pain.   ACTIVITY LIMITATIONS: lifting, sleeping, bathing, toileting, dressing, reach over head, and hygiene/grooming  PARTICIPATION LIMITATIONS: cleaning, laundry, and yard work  PERSONAL FACTORS: Age, Past/current experiences, Profession, and 3+ comorbidities: anxiety, arthritis, melanoma, hypercholesterolemia,   are also affecting patient's functional outcome. REHAB POTENTIAL: Fair    CLINICAL DECISION MAKING: Stable/uncomplicated  EVALUATION COMPLEXITY: Moderate   GOALS: Goals reviewed with patient? Yes  SHORT TERM GOALS: Target date: 04/08/23  Patient will be independent in home exercise program to improve strength/mobility for better functional independence with ADLs. Baseline: Will develop HEP next session  Goal status: INITIAL     LONG TERM GOALS: Target date: 04/29/23  Patient will improve right shoulder AROM to > 140 degrees of flexion for improved ability to perform overhead activities. Baseline: 134 degrees  Goal status: INITIAL  2.  Patient will increase FOTO score to equal to or greater than 80 to demonstrate statistically significant improvement in mobility and quality of life. Baseline: 70 Goal status: INITIAL  3.  Patient will increase RUE gross strength to 4+/5 as to improve functional strength and increased ADL ability. Baseline: In MMT table  Goal status: INITIAL  4.  Patient will donn/doff shirt without discomfort and/or compensation for return to PLOF. Baseline: modify shirt donning/doffing.  Goal status: INITIAL     PLAN:  PT FREQUENCY: 1-2x/week  PT DURATION: 6 weeks  PLANNED INTERVENTIONS: 97164- PT Re-evaluation, 97110-Therapeutic exercises, 97530- Therapeutic activity, 97112-  Neuromuscular re-education, 97535- Self Care, 56213- Manual therapy, [phone]- Gait training, [phone]- Electrical stimulation (manual), Patient/Family education, Balance training, Stair training, Taping, Dry Needling, Joint mobilization, Cryotherapy, and Moist heat  PLAN FOR NEXT SESSION: further assessment (Biceps Load I and II, Bear Hug), Home Exercise Plan, dry needling (possibly of supra, infra, upper trap region), stretching, strengthening    Lenda Kelp, PT 03/21/2023, 1:18 PM     Louis Meckel, PT Physical Therapist - [place] [facility]  Outpatient Physical Therapy- Main Campus [phone]

## 2023-03-27 ENCOUNTER — Ambulatory Visit: Payer: Medicare HMO

## 2023-03-27 DIAGNOSIS — M25611 Stiffness of right shoulder, not elsewhere classified: Secondary | ICD-10-CM

## 2023-03-27 DIAGNOSIS — M25511 Pain in right shoulder: Secondary | ICD-10-CM

## 2023-03-27 NOTE — Therapy (Signed)
OUTPATIENT PHYSICAL THERAPY SHOULDER TREATMENT   Patient Name: Ona Sedgwick MRN: [id number] DOB:Dec 02, 1955, 67 y.o., male Today's Date: 03/27/2023  END OF SESSION:   PT End of Session - 03/27/23 1104     Visit Number 3    Number of Visits 12    Date for PT Re-Evaluation 04/29/23    PT Start Time 1104    PT Stop Time 1145    PT Time Calculation (min) 41 min    Activity Tolerance Patient tolerated treatment well    Behavior During Therapy WFL for tasks assessed/performed              Past Medical History:  Diagnosis Date   Cellulitis    No past surgical history on file. Patient Active Problem List   Diagnosis Date Noted   Anxiety, generalized 02/02/2020   Family history of abdominal aortic aneurysm 02/02/2020   Healthcare maintenance 02/02/2020   Testosterone deficiency 02/02/2020    PCP: Einar Crow, MD  REFERRING PROVIDER: Dorthula Nettles, DO  REFERRING DIAG:  Diagnosis  M25.511 (ICD-10-CM) - Pain in right shoulder  M75.41 (ICD-10-CM) - Impingement syndrome of right shoulder    THERAPY DIAG:  Right shoulder pain, unspecified chronicity  Stiffness of right shoulder, not elsewhere classified  Rationale for Evaluation and Treatment: Rehabilitation  ONSET DATE: April 2024   SUBJECTIVE:                                                                                                                                                                                      SUBJECTIVE STATEMENT:  Pt reports he has been doing the exercises given to him at last session without any complication.  Pt  reports no pain upon arrival today.   Subjective from original PT Eval:  Patient is a 67 year old male presenting to PT evaluation for right shoulder pain. Patient expressed he played sports all his life including table tennis since the age of 67. He states on average he plays three to four hours at a time multiple times a week. He reports he was playing table  tennis and experienced some shoulder tenderness post game. He reports he "put himself" on high dose of medication which did not help and he followed up with his PCP. His PCP performed a MRI which showed tearing in the rotator cuff tendons. Pt then received two steroid injections fifteen days apart one in bursa one in joint which he reported had little to no effect. Pt trialed therapy at [facility] where he did five or six sessions and was not noticing any improvements in strengthening or range of motion. He reports he tried three treatments of infrared  therapy with little to no effects and was discharged because he was not progressing. His  PCP then provided a referral for a surgeon. He is back to trial physical therapy and dry needling to determine if he needs a more invasive approach.  In terms of function, he notices his left arm can reach further behind him than his right and he has had to make adjustments to ADL's due to limited range of motion including putting a shirt on and administering topical agents to his shoulder.     Hand dominance: Right  PERTINENT HISTORY:  Patient has a history of Anxiety, Arthritis BPH (benign prostatic hyperplasia), Folliculitis, Hypercholesterolemia, Melanoma (CMS/HHS-HCC). He has tried physical therapy, rest, and ice with no substantial benefit. Referral notes state, "patient would like to work with someone experienced in dry needling." Patient expressed he played sports all his life including table tennis since the age of 41. He states on average he plays three to four hours at a time multiple times a week. He reports he was playing table tennis and experienced some shoulder tenderness post game. He reports he "put himself" on high dose of medication which did not help and he followed up with his PCP. His PCP performed a MRI which showed tearing in the rotator cuff tendons. Pt then received two steroid injections fifteen days apart one in bursa one in joint which he  reported had little to no effect. Pt trialed therapy at [facility] where he did five or six sessions and was not noticing any improvements in strengthening or range of motion. He reports he tried three treatments of infrared therapy with little to no effects and was discharged because he was not progressing. His  PCP then provided a referral for a surgeon. He is back to trial physical therapy and dry needling to determine if he needs a more invasive approach.  In terms of function, he notices his left arm can reach further behind him than his right and he has had to make adjustments to ADL's due to limited range of motion including putting a shirt on and administering topical agents to his shoulder.   PAIN:  Are you having pain? Yes: NPRS scale: 1-2/10 Pain location: right shoulder  Pain description: dull, achy, does not radiate, very localized Aggravating factors: reaching overhead Relieving factors: has been sleeping on his left shoulder to reduce pain   PRECAUTIONS: None  RED FLAGS: None   WEIGHT BEARING RESTRICTIONS: No  FALLS:  Has patient fallen in last 6 months? No  LIVING ENVIRONMENT: Lives with:  spouse   OCCUPATION: Clinical Hospital Pharmacist -Retired  PLOF: Independent  PATIENT GOALS: reduce pain and discomfort, for right shoulder to feel similar to left shoulder   NEXT MD VISIT: N/A  OBJECTIVE:  Note: Objective measures were completed at Evaluation unless otherwise noted.  DIAGNOSTIC FINDINGS:  Shoulder X-Ray Imaging: These films demonstrate no evidence for fractures, lytic lesions, or significant degenerative changes. The subacromial space is mildly decreased. There is no subacromial or infra-clavicular spurring. He demonstrates a Type II acromion.  Shoulder Imaging, MRI: Right Shoulder: MRI Shoulder Cartilage: No cartilage abnormality. MRI Shoulder Rotator Cuff: Moderate tendinopathic changes of the supraspinatus more so than the infraspinatus tendons with mild  partial-thickness bursal and articular sided tearing. No retraction. MRI Shoulder Labrum / Biceps: Biceps tendinopathy. MRI Shoulder Bone: Normal bone.   PATIENT SURVEYS:  FOTO 62   COGNITION:  Overall cognitive status: Within functional limits for tasks assessed     SENSATION: Not  tested    UPPER EXTREMITY ROM:   Active ROM Right eval Left eval  Shoulder flexion 134 151  Shoulder extension 74 80  Shoulder abduction 141 152  (Blank rows = not tested)  UPPER EXTREMITY MMT:  MMT Right eval Left eval  Shoulder flexion 4/5 5/5  Shoulder extension 4+/5 5/5  Shoulder abduction 4/5 4+/5  Shoulder adduction    Shoulder internal rotation 4/5 4+/5  Shoulder external rotation 4/5 4+/5  Middle trapezius    Lower trapezius    Elbow flexion 4+/5 5/5  Elbow extension 4/5 5/5  (Blank rows = not tested)    SHOULDER SPECIAL TESTS: Impingement tests: Hawkins/Kennedy impingement test: negative and Painful arc test: positive    Rotator cuff assessment: Empty can test: positive , Full can test: negative, Gerber lift off test: negative, and External Rotation Resistance: positive  Biceps assessment:  perform next session Acromioclavicular Palpation: Negative     JOINT MOBILITY TESTING:  Anterior to posterior shoulder mobilization- Some fluid, without pain, moves well  Posterior to Anterior: WFL; some fluid Inferior glide: WFL; some gentle resistance   PALPATION:  Tightness in right supraspinatus, right infraspinatus, right subscapularis, upper traps, and right biceps    TODAY'S TREATMENT: DATE: 03/27/23  Review of shoulder anatomy specifically rotator cuff muscles - Location and action.  Review of dry needling - purpose, indications, and contraindications   Manual:  Supine R shoulder mobilization, inferior glides, Grade III, for improved ROM, 30 sec bouts x 6 bouts Supine R shoulder mobilization, AP glides, Grade III, for improved ROM, 30 sec bouts x 6 bouts STM with  TP release technique applied to the R shoulder including: biceps, deltoid, UT, scapular region for pain modulation and improved ROM    TDN Treatment: (Unbilled)  Patient consent: After explanation of TDN Rationale, Procedures, outcomes, and potential side effects, patient verbalized consent to TDN treatment. Region/Dx: R shoulder immobility Muscles Treated: R UT, R infraspinatus, Middle deltoid region, Anterior deltoid region using 0.3 x 30 needles Post treatment pain/response: 1 local twitch response in R infraspinatus region and no adverse reaction to needling today.  Post treatment Instructions: Patient instructed to expect mild to moderate muscle soreness this evening and tomorrow. Patient instructed to continued prescribed home exercise program. Since dry needling over lung field, patient instructed of signs and symptoms of pneumothorax. Patient also educated on signs and symptoms of infection, however unlikely, and to seek immediate medical attention shoulder thy occur. Patient verbalized understanding of these instructions.      PATIENT EDUCATION: Education details: POC, dry needling Person educated: Patient Education method: Medical illustrator Education comprehension: verbalized understanding  HOME EXERCISE PROGRAM:  Will introduce next session   ASSESSMENT:  CLINICAL IMPRESSION:  Pt responded favorably to treatment session, noting an increase in overall ROM of the R shoulder as well as reduction of the pain while performing the mobility.  Pt did note to have some joint restrictions with the inferior glides when compared to the L shoulder.  Pt would likely benefit from continued manual therapy and dry needling approaches in order to improve overall exercises tolerance and improved ROM.   Pt will continue to benefit from skilled therapy to address remaining deficits in order to improve overall QoL and return to PLOF.     OBJECTIVE IMPAIRMENTS: decreased activity  tolerance, decreased ROM, decreased strength, impaired perceived functional ability, impaired flexibility, impaired UE functional use, improper body mechanics, postural dysfunction, and pain.   ACTIVITY LIMITATIONS: lifting, sleeping, bathing, toileting, dressing, reach  over head, and hygiene/grooming  PARTICIPATION LIMITATIONS: cleaning, laundry, and yard work  PERSONAL FACTORS: Age, Past/current experiences, Profession, and 3+ comorbidities: anxiety, arthritis, melanoma, hypercholesterolemia,   are also affecting patient's functional outcome. REHAB POTENTIAL: Fair    CLINICAL DECISION MAKING: Stable/uncomplicated  EVALUATION COMPLEXITY: Moderate   GOALS: Goals reviewed with patient? Yes  SHORT TERM GOALS: Target date: 04/08/23  Patient will be independent in home exercise program to improve strength/mobility for better functional independence with ADLs. Baseline: Will develop HEP next session  Goal status: INITIAL     LONG TERM GOALS: Target date: 04/29/23  Patient will improve right shoulder AROM to > 140 degrees of flexion for improved ability to perform overhead activities. Baseline: 134 degrees  Goal status: INITIAL  2.  Patient will increase FOTO score to equal to or greater than 80 to demonstrate statistically significant improvement in mobility and quality of life. Baseline: 70 Goal status: INITIAL  3.  Patient will increase RUE gross strength to 4+/5 as to improve functional strength and increased ADL ability. Baseline: In MMT table  Goal status: INITIAL  4.  Patient will donn/doff shirt without discomfort and/or compensation for return to PLOF. Baseline: modify shirt donning/doffing.  Goal status: INITIAL     PLAN:  PT FREQUENCY: 1-2x/week  PT DURATION: 6 weeks  PLANNED INTERVENTIONS: 97164- PT Re-evaluation, 97110-Therapeutic exercises, 97530- Therapeutic activity, 97112- Neuromuscular re-education, 97535- Self Care, 16109- Manual therapy, [phone]- Gait  training, [phone]- Electrical stimulation (manual), Patient/Family education, Balance training, Stair training, Taping, Dry Needling, Joint mobilization, Cryotherapy, and Moist heat  PLAN FOR NEXT SESSION: further assessment (Biceps Load I and II, Bear Hug), Home Exercise Plan, dry needling (possibly of supra, infra, upper trap region), stretching, strengthening, mobilization of the shoulder    Nolon Bussing, PT, DPT Physical Therapist - [facility]  03/27/23, 2:18 PM

## 2023-04-02 NOTE — Therapy (Signed)
OUTPATIENT PHYSICAL THERAPY SHOULDER TREATMENT/ DISCHARGE   Patient Name: Joshua Glover MRN: [id number] DOB:Feb 17, 1956, 67 y.o., male Today's Date: 04/03/2023  END OF SESSION:   PT End of Session - 04/03/23 1604     Visit Number 4    Number of Visits 12    Date for PT Re-Evaluation 04/29/23    PT Start Time 1530    PT Stop Time 1600    PT Time Calculation (min) 30 min    Activity Tolerance Patient tolerated treatment well    Behavior During Therapy [facility] for tasks assessed/performed               Past Medical History:  Diagnosis Date   Cellulitis    History reviewed. No pertinent surgical history. Patient Active Problem List   Diagnosis Date Noted   Anxiety, generalized 02/02/2020   Family history of abdominal aortic aneurysm 02/02/2020   Healthcare maintenance 02/02/2020   Testosterone deficiency 02/02/2020    PCP: Einar Crow, MD  REFERRING PROVIDER: Dorthula Nettles, DO  REFERRING DIAG:  Diagnosis  M25.511 (ICD-10-CM) - Pain in right shoulder  M75.41 (ICD-10-CM) - Impingement syndrome of right shoulder    THERAPY DIAG:  Right shoulder pain, unspecified chronicity  Stiffness of right shoulder, not elsewhere classified  Rationale for Evaluation and Treatment: Rehabilitation  ONSET DATE: April 2024   SUBJECTIVE:                                                                                                                                                                                      SUBJECTIVE STATEMENT:  Patient reports he has not had improvement from TDN.    Subjective from original PT Eval:  Patient is a 67 year old male presenting to PT evaluation for right shoulder pain. Patient expressed he played sports all his life including table tennis since the age of 30. He states on average he plays three to four hours at a time multiple times a week. He reports he was playing table tennis and experienced some shoulder tenderness post game.  He reports he "put himself" on high dose of medication which did not help and he followed up with his PCP. His PCP performed a MRI which showed tearing in the rotator cuff tendons. Pt then received two steroid injections fifteen days apart one in bursa one in joint which he reported had little to no effect. Pt trialed therapy at [facility] where he did five or six sessions and was not noticing any improvements in strengthening or range of motion. He reports he tried three treatments of infrared therapy with little to no effects and was discharged because he was not progressing.  His  PCP then provided a referral for a surgeon. He is back to trial physical therapy and dry needling to determine if he needs a more invasive approach.  In terms of function, he notices his left arm can reach further behind him than his right and he has had to make adjustments to ADL's due to limited range of motion including putting a shirt on and administering topical agents to his shoulder.     Hand dominance: Right  PERTINENT HISTORY:  Patient has a history of Anxiety, Arthritis BPH (benign prostatic hyperplasia), Folliculitis, Hypercholesterolemia, Melanoma (CMS/HHS-HCC). He has tried physical therapy, rest, and ice with no substantial benefit. Referral notes state, "patient would like to work with someone experienced in dry needling." Patient expressed he played sports all his life including table tennis since the age of 1. He states on average he plays three to four hours at a time multiple times a week. He reports he was playing table tennis and experienced some shoulder tenderness post game. He reports he "put himself" on high dose of medication which did not help and he followed up with his PCP. His PCP performed a MRI which showed tearing in the rotator cuff tendons. Pt then received two steroid injections fifteen days apart one in bursa one in joint which he reported had little to no effect. Pt trialed therapy at [facility]  where he did five or six sessions and was not noticing any improvements in strengthening or range of motion. He reports he tried three treatments of infrared therapy with little to no effects and was discharged because he was not progressing. His  PCP then provided a referral for a surgeon. He is back to trial physical therapy and dry needling to determine if he needs a more invasive approach.  In terms of function, he notices his left arm can reach further behind him than his right and he has had to make adjustments to ADL's due to limited range of motion including putting a shirt on and administering topical agents to his shoulder.   PAIN:  Are you having pain? Yes: NPRS scale: 1-2/10 Pain location: right shoulder  Pain description: dull, achy, does not radiate, very localized Aggravating factors: reaching overhead Relieving factors: has been sleeping on his left shoulder to reduce pain   PRECAUTIONS: None  RED FLAGS: None   WEIGHT BEARING RESTRICTIONS: No  FALLS:  Has patient fallen in last 6 months? No  LIVING ENVIRONMENT: Lives with:  spouse   OCCUPATION: Clinical Hospital Pharmacist -Retired  PLOF: Independent  PATIENT GOALS: reduce pain and discomfort, for right shoulder to feel similar to left shoulder   NEXT MD VISIT: N/A  OBJECTIVE:  Note: Objective measures were completed at Evaluation unless otherwise noted.  DIAGNOSTIC FINDINGS:  Shoulder X-Ray Imaging: These films demonstrate no evidence for fractures, lytic lesions, or significant degenerative changes. The subacromial space is mildly decreased. There is no subacromial or infra-clavicular spurring. He demonstrates a Type II acromion.  Shoulder Imaging, MRI: Right Shoulder: MRI Shoulder Cartilage: No cartilage abnormality. MRI Shoulder Rotator Cuff: Moderate tendinopathic changes of the supraspinatus more so than the infraspinatus tendons with mild partial-thickness bursal and articular sided tearing. No  retraction. MRI Shoulder Labrum / Biceps: Biceps tendinopathy. MRI Shoulder Bone: Normal bone.   PATIENT SURVEYS:  FOTO 93   COGNITION:  Overall cognitive status: Within functional limits for tasks assessed     SENSATION: Not tested    UPPER EXTREMITY ROM:   Active ROM Right eval Left  eval  Shoulder flexion 134 151  Shoulder extension 74 80  Shoulder abduction 141 152  (Blank rows = not tested)  UPPER EXTREMITY MMT:  MMT Right eval Left eval  Shoulder flexion 4/5 5/5  Shoulder extension 4+/5 5/5  Shoulder abduction 4/5 4+/5  Shoulder adduction    Shoulder internal rotation 4/5 4+/5  Shoulder external rotation 4/5 4+/5  Middle trapezius    Lower trapezius    Elbow flexion 4+/5 5/5  Elbow extension 4/5 5/5  (Blank rows = not tested)    SHOULDER SPECIAL TESTS: Impingement tests: Hawkins/Kennedy impingement test: negative and Painful arc test: positive    Rotator cuff assessment: Empty can test: positive , Full can test: negative, Gerber lift off test: negative, and External Rotation Resistance: positive  Biceps assessment:  perform next session Acromioclavicular Palpation: Negative     JOINT MOBILITY TESTING:  Anterior to posterior shoulder mobilization- Some fluid, without pain, moves well  Posterior to Anterior: WFL; some fluid Inferior glide: WFL; some gentle resistance   PALPATION:  Tightness in right supraspinatus, right infraspinatus, right subscapularis, upper traps, and right biceps    TODAY'S TREATMENT: DATE: 04/03/23  Review of shoulder anatomy specifically rotator cuff muscles - Location and action.  Review of dry needling - purpose, indications, and contraindications Review of discharge   Manual: Palpation for muscle tension     TDN Treatment: (Unbilled)  Patient consent: After explanation of TDN Rationale, Procedures, outcomes, and potential side effects, patient verbalized consent to TDN treatment. Region/Dx: R shoulder  immobility Muscles Treated: R UT, R infraspinatus, supraspinatus, lat using 0.3 x 30 needles Post treatment pain/response: 1 local twitch response in R infraspinatus region and upper trap and no adverse reaction to needling today.  Post treatment Instructions: Patient instructed to expect mild to moderate muscle soreness this evening and tomorrow. Patient instructed to continued prescribed home exercise program. Since dry needling over lung field, patient instructed of signs and symptoms of pneumothorax. Patient also educated on signs and symptoms of infection, however unlikely, and to seek immediate medical attention shoulder thy occur. Patient verbalized understanding of these instructions.      PATIENT EDUCATION: Education details: POC, dry needling Person educated: Patient Education method: Medical illustrator Education comprehension: verbalized understanding  HOME EXERCISE PROGRAM:  Will introduce next session   ASSESSMENT:  CLINICAL IMPRESSION: Patient to be discharged due to no noticeable improvement with implementation of dry needling. Sending patient back to physician for next steps. Patient is agreeable.    OBJECTIVE IMPAIRMENTS: decreased activity tolerance, decreased ROM, decreased strength, impaired perceived functional ability, impaired flexibility, impaired UE functional use, improper body mechanics, postural dysfunction, and pain.   ACTIVITY LIMITATIONS: lifting, sleeping, bathing, toileting, dressing, reach over head, and hygiene/grooming  PARTICIPATION LIMITATIONS: cleaning, laundry, and yard work  PERSONAL FACTORS: Age, Past/current experiences, Profession, and 3+ comorbidities: anxiety, arthritis, melanoma, hypercholesterolemia,   are also affecting patient's functional outcome. REHAB POTENTIAL: Fair    CLINICAL DECISION MAKING: Stable/uncomplicated  EVALUATION COMPLEXITY: Moderate   GOALS: Goals reviewed with patient? Yes  SHORT TERM GOALS: Target  date: 04/08/23  Patient will be independent in home exercise program to improve strength/mobility for better functional independence with ADLs. Baseline: Will develop HEP next session  Goal status: INITIAL     LONG TERM GOALS: Target date: 04/29/23  Patient will improve right shoulder AROM to > 140 degrees of flexion for improved ability to perform overhead activities. Baseline: 134 degrees  Goal status: INITIAL  2.  Patient will increase  FOTO score to equal to or greater than 80 to demonstrate statistically significant improvement in mobility and quality of life. Baseline: 70 Goal status: INITIAL  3.  Patient will increase RUE gross strength to 4+/5 as to improve functional strength and increased ADL ability. Baseline: In MMT table  Goal status: INITIAL  4.  Patient will donn/doff shirt without discomfort and/or compensation for return to PLOF. Baseline: modify shirt donning/doffing.  Goal status: INITIAL     PLAN:  PT FREQUENCY: 1-2x/week  PT DURATION: 6 weeks  PLANNED INTERVENTIONS: 97164- PT Re-evaluation, 97110-Therapeutic exercises, 97530- Therapeutic activity, 97112- Neuromuscular re-education, 97535- Self Care, 62130- Manual therapy, [phone]- Gait training, [phone]- Electrical stimulation (manual), Patient/Family education, Balance training, Stair training, Taping, Dry Needling, Joint mobilization, Cryotherapy, and Moist heat  PLAN FOR NEXT SESSION:    Precious Bard PT  Physical Therapist - [facility]  04/03/23, 4:08 PM

## 2023-04-03 ENCOUNTER — Ambulatory Visit: Payer: Medicare HMO | Attending: Sports Medicine

## 2023-04-03 DIAGNOSIS — M25611 Stiffness of right shoulder, not elsewhere classified: Secondary | ICD-10-CM | POA: Insufficient documentation

## 2023-04-03 DIAGNOSIS — M25511 Pain in right shoulder: Secondary | ICD-10-CM | POA: Insufficient documentation

## 2023-04-09 ENCOUNTER — Ambulatory Visit: Payer: Medicare HMO

## 2023-04-17 ENCOUNTER — Ambulatory Visit: Payer: Medicare HMO

## 2023-05-02 ENCOUNTER — Other Ambulatory Visit: Payer: Medicare HMO

## 2023-05-02 DIAGNOSIS — R972 Elevated prostate specific antigen [PSA]: Secondary | ICD-10-CM

## 2023-05-02 DIAGNOSIS — E291 Testicular hypofunction: Secondary | ICD-10-CM

## 2023-05-04 LAB — TESTOSTERONE, BIOAVAILABLE (M)
Testost., % Free&Weakly Bound: 27.8 % (ref 9.0–46.0)
Testost., F&W Bound: 211.8 ng/dL (ref 40.0–250.0)
Testosterone: 762 ng/dL (ref 264–916)

## 2023-05-04 LAB — HEMATOCRIT: Hematocrit: 52.5 % — ABNORMAL HIGH (ref 37.5–51.0)

## 2023-05-04 LAB — PSA: Prostate Specific Ag, Serum: 6.2 ng/mL — ABNORMAL HIGH (ref 0.0–4.0)

## 2023-05-08 ENCOUNTER — Encounter: Payer: Self-pay | Admitting: Urology

## 2023-05-08 DIAGNOSIS — E291 Testicular hypofunction: Secondary | ICD-10-CM

## 2023-05-09 NOTE — Addendum Note (Signed)
Addended by: Consuella Lose on: 05/09/2023 04:28 PM   Modules accepted: Orders

## 2023-05-20 ENCOUNTER — Encounter: Payer: Self-pay | Admitting: Urology

## 2023-05-23 ENCOUNTER — Other Ambulatory Visit: Payer: Self-pay

## 2023-05-23 ENCOUNTER — Telehealth: Payer: Self-pay | Admitting: Urology

## 2023-05-23 DIAGNOSIS — R972 Elevated prostate specific antigen [PSA]: Secondary | ICD-10-CM

## 2023-05-23 NOTE — Telephone Encounter (Signed)
Pt stopped by office to see why MRI order was never requested.  He said 12/5 he was told he would get a prostate MRI.  He would like this done by the end of the year.

## 2023-05-23 NOTE — Telephone Encounter (Signed)
Patient sent a my chart message I forward note to Dr. Lonna Cobb

## 2023-05-24 ENCOUNTER — Encounter: Payer: Self-pay | Admitting: Urology

## 2023-05-24 ENCOUNTER — Ambulatory Visit
Admission: RE | Admit: 2023-05-24 | Discharge: 2023-05-24 | Disposition: A | Discharge status: Home / Self Care | Payer: Medicare HMO | Source: Ambulatory Visit | Attending: Urology | Admitting: Urology

## 2023-05-24 DIAGNOSIS — R972 Elevated prostate specific antigen [PSA]: Secondary | ICD-10-CM | POA: Diagnosis present

## 2023-05-24 MED ORDER — GADOBUTROL 1 MMOL/ML IV SOLN
8.0000 mL | Freq: Once | INTRAVENOUS | Status: AC | PRN
  Administered 2023-05-24: 8 mL via INTRAVENOUS

## 2023-05-28 ENCOUNTER — Encounter: Payer: Self-pay | Admitting: *Deleted

## 2023-06-11 ENCOUNTER — Other Ambulatory Visit: Payer: Self-pay | Admitting: Otolaryngology

## 2023-06-11 DIAGNOSIS — H9041 Sensorineural hearing loss, unilateral, right ear, with unrestricted hearing on the contralateral side: Secondary | ICD-10-CM

## 2023-06-24 ENCOUNTER — Ambulatory Visit
Admission: RE | Admit: 2023-06-24 | Discharge: 2023-06-24 | Disposition: A | Discharge status: Home / Self Care | Payer: Medicare HMO | Source: Ambulatory Visit | Attending: Otolaryngology | Admitting: Otolaryngology

## 2023-06-24 DIAGNOSIS — H9041 Sensorineural hearing loss, unilateral, right ear, with unrestricted hearing on the contralateral side: Secondary | ICD-10-CM

## 2023-06-24 MED ORDER — GADOPICLENOL 0.5 MMOL/ML IV SOLN
10.0000 mL | Freq: Once | INTRAVENOUS | Status: AC | PRN
  Administered 2023-06-24: 8 mL via INTRAVENOUS

## 2023-06-27 ENCOUNTER — Ambulatory Visit: Payer: Medicare HMO

## 2023-06-27 DIAGNOSIS — K2950 Unspecified chronic gastritis without bleeding: Secondary | ICD-10-CM | POA: Diagnosis not present

## 2023-06-27 DIAGNOSIS — Z1211 Encounter for screening for malignant neoplasm of colon: Secondary | ICD-10-CM | POA: Diagnosis present

## 2023-07-02 ENCOUNTER — Encounter: Payer: Self-pay | Admitting: Urology

## 2023-07-05 ENCOUNTER — Other Ambulatory Visit: Payer: Self-pay | Admitting: Internal Medicine

## 2023-07-05 DIAGNOSIS — Z136 Encounter for screening for cardiovascular disorders: Secondary | ICD-10-CM

## 2023-07-05 DIAGNOSIS — Z8489 Family history of other specified conditions: Secondary | ICD-10-CM

## 2023-07-10 ENCOUNTER — Other Ambulatory Visit: Payer: Medicare HMO

## 2023-07-10 DIAGNOSIS — E291 Testicular hypofunction: Secondary | ICD-10-CM

## 2023-07-11 LAB — HEMOGLOBIN AND HEMATOCRIT, BLOOD
Hematocrit: 49.5 % (ref 37.5–51.0)
Hemoglobin: 16.3 g/dL (ref 13.0–17.7)

## 2023-07-17 ENCOUNTER — Other Ambulatory Visit: Payer: Medicare HMO | Admitting: Urology

## 2023-07-22 ENCOUNTER — Encounter: Payer: Self-pay | Admitting: Urology

## 2023-07-24 ENCOUNTER — Other Ambulatory Visit: Payer: Self-pay | Admitting: Urology

## 2023-07-24 ENCOUNTER — Ambulatory Visit: Payer: Self-pay | Admitting: Urology

## 2023-07-24 DIAGNOSIS — R972 Elevated prostate specific antigen [PSA]: Secondary | ICD-10-CM

## 2023-07-24 NOTE — Progress Notes (Signed)
 Referral was placed

## 2023-07-25 NOTE — Telephone Encounter (Signed)
 Pt called asking about fusion biopsy being done at [facility] in [place].  He hadn't heard anything so he called [facility].  They don't know anything about this.

## 2023-08-15 ENCOUNTER — Encounter: Payer: Self-pay | Admitting: Urology

## 2023-08-26 ENCOUNTER — Ambulatory Visit: Payer: Medicare HMO | Admitting: Urology

## 2023-08-26 ENCOUNTER — Telehealth: Payer: Self-pay | Admitting: Urology

## 2023-08-26 NOTE — Telephone Encounter (Signed)
 I contacted Joshua Glover regarding his prostate pathology results.  He underwent MR fusion biopsy in [facility] 08/07/2023 for a PSA of 6.2 and prostate MRI showing a PI-RADS 4 lesion right anterior peripheral zone and right anterior fibromuscular stroma.  He underwent a standard 12 core prostate biopsy + ROI biopsies x 3.  All ROI biopsy showed benign prostate tissue.  12/12 template cores showed no prostate cancer but the LLB showed a focus of high-grade PIN in the RLB showed high-grade PIN with adjacent small focus of atypia.  The pathology report was discussed in detail and we discussed the need for continued monitoring and possible rebiopsy in the future.  All questions were answered.  His appointment today will be canceled and he will keep his regular scheduled follow-up June 2025

## 2023-09-11 ENCOUNTER — Other Ambulatory Visit: Payer: Medicare HMO | Admitting: Urology

## 2023-09-23 ENCOUNTER — Ambulatory Visit: Payer: Medicare HMO | Admitting: Urology

## 2023-10-14 ENCOUNTER — Encounter: Payer: Self-pay | Admitting: Urology

## 2023-10-14 ENCOUNTER — Other Ambulatory Visit: Payer: Self-pay | Admitting: *Deleted

## 2023-10-14 MED ORDER — AMBULATORY NON FORMULARY MEDICATION: 1 refills | Status: DC

## 2023-10-16 ENCOUNTER — Telehealth: Payer: Self-pay

## 2023-10-16 NOTE — Telephone Encounter (Signed)
 [facility] LM on triage line stating they received a faxed RX for testosterone  however there were no instructions. They would like to know if patient is to continue his previous dose of 0.75ml daily. Please advise.

## 2023-10-17 NOTE — Telephone Encounter (Signed)
 Patient lvm regarding directions missing on testosterone  prescription sent to [facility]. Per previous note, the pharmacy had already notified our office. Dr. Cherylene Corrente was made aware, and Camilo Cella will be contacting [facility]. I called patient back and lvm advising him of this.

## 2023-10-17 NOTE — Telephone Encounter (Signed)
 Called [facility] and advised same dose

## 2023-10-30 ENCOUNTER — Other Ambulatory Visit: Payer: Self-pay

## 2023-10-30 DIAGNOSIS — R972 Elevated prostate specific antigen [PSA]: Secondary | ICD-10-CM

## 2023-10-30 DIAGNOSIS — E291 Testicular hypofunction: Secondary | ICD-10-CM

## 2023-10-30 DIAGNOSIS — N400 Enlarged prostate without lower urinary tract symptoms: Secondary | ICD-10-CM

## 2023-10-31 ENCOUNTER — Other Ambulatory Visit: Payer: Self-pay

## 2023-10-31 DIAGNOSIS — E291 Testicular hypofunction: Secondary | ICD-10-CM

## 2023-10-31 DIAGNOSIS — R972 Elevated prostate specific antigen [PSA]: Secondary | ICD-10-CM

## 2023-10-31 DIAGNOSIS — N400 Enlarged prostate without lower urinary tract symptoms: Secondary | ICD-10-CM

## 2023-11-01 ENCOUNTER — Ambulatory Visit: Payer: Self-pay | Admitting: Urology

## 2023-11-01 ENCOUNTER — Encounter: Payer: Self-pay | Admitting: Urology

## 2023-11-01 VITALS — BP 161/65 | HR 76 | Ht 70.0 in | Wt 174.0 lb

## 2023-11-01 DIAGNOSIS — E291 Testicular hypofunction: Secondary | ICD-10-CM | POA: Diagnosis not present

## 2023-11-01 DIAGNOSIS — N400 Enlarged prostate without lower urinary tract symptoms: Secondary | ICD-10-CM | POA: Diagnosis not present

## 2023-11-01 DIAGNOSIS — R972 Elevated prostate specific antigen [PSA]: Secondary | ICD-10-CM

## 2023-11-01 NOTE — Progress Notes (Signed)
   11/01/2023 4:17 PM   [name] 1955/06/09 [id number]  Referring provider: Jimmy Moulding, MD [address] [facility] [place] [address] [place],  [place] [address]  Chief Complaint  Patient presents with   Hypogonadism   Urologic history:  1.  Hypogonadism TRT since 2003 Retired [employer], music and particular about dosing/labs; follows total and bioavailable testosterone   2.  BPH Tadalafil  and alfuzosin in the past Rezum January 2021 with symptom improvement  3.  Elevated PSA Baseline PSA 5-9 Benign biopsy June 2019; PSA 5.3 (uncorrected for finasteride) Prostate MRI 12/2018; 62 g gland, no suspicious lesions Repeat prostate MRI 05/24/2023 PSA bump 6.3.  47.1 cc volume; PI-RADS 4 lesion right anterior PZ and anterior fibromuscular stroma MR fusion biopsy [facility] 08/07/2023; 12 core template + ROI bx x 3.  Path: ROI with benign prostate tissue 11/12 cores template biopsy benign; LLB with focus high-grade PIN and adjacent focus ASAP  4.  Erectile dysfunction Tadalafil  5 mg daily  HPI: Joshua Glover is a 68 y.o. male who presents for annual follow-up  No complaints since last visit Stable symptoms on TRT Testosterone , PSA, HCT drawn yesterday but are pending No bothersome LUTS   PMH: Past Medical History:  Diagnosis Date   Cellulitis     Surgical History: No past surgical history on file.  Home Medications:  Allergies as of 11/01/2023   No Known Allergies      Medication List        Accurate as of November 01, 2023  4:17 PM. If you have any questions, ask your nurse or doctor.          AMBULATORY NON FORMULARY MEDICATION Testosterone  (AT) (Topi) 15% (150mg /ML) Gel   atorvastatin 10 MG tablet Commonly known as: LIPITOR Take 10 mg by mouth at bedtime.   gabapentin 300 MG capsule Commonly known as: NEURONTIN Take 300 mg by mouth 3 (three) times daily.   potassium chloride 10 MEQ tablet Commonly known as: KLOR-CON Take 10 mEq by  mouth daily.   QC Calcium/Minerals/Vitamin D 600-400 MG-UNIT Tabs 1 tablet with a meal Orally Once a day        Allergies: No Known Allergies  Family History: No family history on file.  Social History:  reports that he has never smoked. He has never used smokeless tobacco. He reports that he does not drink alcohol and does not use drugs.   Physical Exam: BP (!) 161/65   Pulse 76   Ht 5\' 10"  (1.778 m)   Wt 174 lb (78.9 kg)   BMI 24.97 kg/m   Constitutional:  Alert and oriented, No acute distress. HEENT: [place] AT Respiratory: Normal respiratory effort, no increased work of breathing. GU: Prostate 50 g, smooth without nodules Psychiatric: Normal mood and affect.   Assessment & Plan:    1.  Hypogonadism Stable Labs pending Lab visit 6 months; testosterone  (total and bioavailable), PSA, H/H Office visit 1 year with labs and DRE  2.  Elevated PSA Benign DRE PSA pending  3.  BPH Stable  4.  Erectile dysfunction Stable   Geraline Knapp, MD  [facility]  [address] [place], [place] [address] [phone]

## 2023-11-02 ENCOUNTER — Ambulatory Visit: Payer: Self-pay | Admitting: Urology

## 2023-11-06 LAB — HEMATOCRIT: Hematocrit: 50.4 % (ref 37.5–51.0)

## 2023-11-06 LAB — TESTOSTERONE, BIOAVAILABLE (M)
Testost., % Free&Weakly Bound: 23 % (ref 9.0–46.0)
Testost., F&W Bound: 210.2 ng/dL (ref 40.0–250.0)
Testosterone: 914 ng/dL (ref 264–916)

## 2023-11-06 LAB — PSA: Prostate Specific Ag, Serum: 5.5 ng/mL — ABNORMAL HIGH (ref 0.0–4.0)

## 2024-04-28 ENCOUNTER — Other Ambulatory Visit: Payer: Self-pay

## 2024-04-28 DIAGNOSIS — E291 Testicular hypofunction: Secondary | ICD-10-CM

## 2024-04-28 DIAGNOSIS — R972 Elevated prostate specific antigen [PSA]: Secondary | ICD-10-CM

## 2024-05-04 ENCOUNTER — Encounter: Payer: Self-pay | Admitting: Urology

## 2024-05-04 ENCOUNTER — Other Ambulatory Visit

## 2024-05-04 DIAGNOSIS — R972 Elevated prostate specific antigen [PSA]: Secondary | ICD-10-CM

## 2024-05-04 DIAGNOSIS — E291 Testicular hypofunction: Secondary | ICD-10-CM

## 2024-05-19 LAB — BIOAVAILABLE TESTOST. W/O SHBG
Bioavailable Testosterone, %: 28.5 %
Bioavailable Testosterone, S: 139 ng/dL
Testosterone, Serum (Total): 489 ng/dL

## 2024-05-19 LAB — HEMOGLOBIN AND HEMATOCRIT, BLOOD
Hematocrit: 48.1 % (ref 37.5–51.0)
Hemoglobin: 15.2 g/dL (ref 13.0–17.7)

## 2024-05-19 LAB — PSA: Prostate Specific Ag, Serum: 5.7 ng/mL — ABNORMAL HIGH (ref 0.0–4.0)

## 2024-05-19 LAB — TESTOSTERONE: Testosterone: 555 ng/dL (ref 264–916)

## 2024-05-30 ENCOUNTER — Encounter: Payer: Self-pay | Admitting: Urology

## 2024-06-05 ENCOUNTER — Other Ambulatory Visit: Payer: Self-pay | Admitting: *Deleted

## 2024-06-05 ENCOUNTER — Encounter: Payer: Self-pay | Admitting: Urology

## 2024-06-05 MED ORDER — AMBULATORY NON FORMULARY MEDICATION: 0.7500 mL | Freq: Every day | 1 refills | Status: AC

## 2024-06-05 MED ORDER — AMBULATORY NON FORMULARY MEDICATION: 0.7500 mL | Freq: Every day | 1 refills | Status: DC

## 2024-06-05 NOTE — Addendum Note (Signed)
 Addended by: GENITA HARLENE CROME on: 06/05/2024 09:57 AM   Modules accepted: Orders

## 2024-10-26 ENCOUNTER — Other Ambulatory Visit

## 2024-10-28 ENCOUNTER — Ambulatory Visit: Admitting: Urology

## 2024-10-30 ENCOUNTER — Ambulatory Visit: Admitting: Urology
